# Patient Record
Sex: Male | Born: 1995 | Race: Black or African American | Hispanic: No | Marital: Single | Smoking: Never smoker
Health system: Southern US, Community
[De-identification: ages and names within clinical notes are randomized; demographics above are authoritative.]

## PROBLEM LIST (undated history)

## (undated) DIAGNOSIS — S52502A Unspecified fracture of the lower end of left radius, initial encounter for closed fracture: Secondary | ICD-10-CM

## (undated) HISTORY — PX: NO PAST SURGERIES: SHX2092

---

## 2017-01-22 ENCOUNTER — Emergency Department (HOSPITAL_COMMUNITY): Payer: Self-pay

## 2017-01-22 ENCOUNTER — Encounter (HOSPITAL_COMMUNITY): Payer: Self-pay

## 2017-01-22 DIAGNOSIS — Y9367 Activity, basketball: Secondary | ICD-10-CM | POA: Insufficient documentation

## 2017-01-22 DIAGNOSIS — S52502A Unspecified fracture of the lower end of left radius, initial encounter for closed fracture: Secondary | ICD-10-CM

## 2017-01-22 DIAGNOSIS — S52592A Other fractures of lower end of left radius, initial encounter for closed fracture: Secondary | ICD-10-CM | POA: Insufficient documentation

## 2017-01-22 DIAGNOSIS — Y999 Unspecified external cause status: Secondary | ICD-10-CM | POA: Insufficient documentation

## 2017-01-22 DIAGNOSIS — Y929 Unspecified place or not applicable: Secondary | ICD-10-CM | POA: Insufficient documentation

## 2017-01-22 DIAGNOSIS — S52612A Displaced fracture of left ulna styloid process, initial encounter for closed fracture: Secondary | ICD-10-CM | POA: Insufficient documentation

## 2017-01-22 DIAGNOSIS — W1830XA Fall on same level, unspecified, initial encounter: Secondary | ICD-10-CM | POA: Insufficient documentation

## 2017-01-22 HISTORY — DX: Unspecified fracture of the lower end of left radius, initial encounter for closed fracture: S52.502A

## 2017-01-22 MED ORDER — FENTANYL CITRATE (PF) 100 MCG/2ML IJ SOLN: 50.0000 ug | INTRAMUSCULAR | Status: DC | PRN

## 2017-01-22 MED ORDER — FENTANYL CITRATE (PF) 100 MCG/2ML IJ SOLN
INTRAMUSCULAR | Status: AC
  Filled 2017-01-22: qty 2

## 2017-01-22 NOTE — ED Triage Notes (Signed)
Onset 15 min PTA pt dunked basketball and fell over on left arm.  Obvious deformity to left wrist.  + radial pulse.

## 2017-01-23 ENCOUNTER — Emergency Department (HOSPITAL_COMMUNITY)
Admission: EM | Admit: 2017-01-23 | Discharge: 2017-01-23 | Disposition: A | Discharge status: Home / Self Care | Payer: Self-pay | Attending: Emergency Medicine | Admitting: Emergency Medicine

## 2017-01-23 DIAGNOSIS — S52592A Other fractures of lower end of left radius, initial encounter for closed fracture: Secondary | ICD-10-CM

## 2017-01-23 DIAGNOSIS — S52612A Displaced fracture of left ulna styloid process, initial encounter for closed fracture: Secondary | ICD-10-CM

## 2017-01-23 MED ORDER — OXYCODONE-ACETAMINOPHEN 5-325 MG PO TABS
1.0000 | ORAL_TABLET | Freq: Once | ORAL | Status: AC
  Administered 2017-01-23: 1 via ORAL
  Filled 2017-01-23: qty 1

## 2017-01-23 MED ORDER — HYDROCODONE-ACETAMINOPHEN 5-325 MG PO TABS: 1.0000 | ORAL_TABLET | Freq: Four times a day (QID) | ORAL | 0 refills | Status: DC | PRN

## 2017-01-23 NOTE — Progress Notes (Signed)
Orthopedic Tech Progress Note Patient Details:  Frederick Bauer 09/22/1996 [id number]  Ortho Devices Type of Ortho Device: Sugartong splint Ortho Device/Splint Location: lue Ortho Device/Splint Interventions: Ordered, Application   [name], Honor Fairbank J 01/23/2017, 3:25 AM

## 2017-01-23 NOTE — Discharge Instructions (Signed)
You were seen today and have a fracture of the bones in your wrist. Maintain the splint until you follow-up with the hand surgeon. You need to call his office later today for follow-up as soon as possible. If you develop numbness, tingling or worsening pain you should be reevaluated immediately.

## 2017-01-23 NOTE — ED Notes (Signed)
Checked with pt.  He states that he is "ok"

## 2017-01-23 NOTE — ED Provider Notes (Signed)
[facility]-EMERGENCY DEPT Provider Note   CSN: [id number] Arrival date & time: 01/22/17  2141  By signing my name below, I, Arianna Nassar and Diona BrownerJennifer Gorman, attest that this documentation has been prepared under the direction and in the presence of Shon Batonourtney F Latoyna Hird, MD.  Electronically Signed: Octavia HeirArianna Nassar, ED Scribe. 01/23/17. 1:19 AM.    History   Chief Complaint Chief Complaint  Patient presents with  . Wrist Injury    HPI HPI Comments: Frederick Bauer is a 21 y.o. male who presents to the Emergency Department complaining of left wrist pain s/p a fall that occurred earlier this evening. Pt rates his current pain a 6-7/10. He reports he was playing basketball and fell after dunking the ball in the basket. Pt states he did not hit his head or lose consciousness. He expresses his pain is worse with movement. There are no other injuries noted. Pt denies numbness or tingling of his left wrist.   The history is provided by the patient. No language interpreter was used.    History reviewed. No pertinent past medical history.  There are no active problems to display for this patient.   History reviewed. No pertinent surgical history.     Home Medications    Prior to Admission medications   Not on File    Family History History reviewed. No pertinent family history.  Social History Social History  Substance Use Topics  . Smoking status: Never Smoker  . Smokeless tobacco: Never Used  . Alcohol use No     Allergies   Patient has no known allergies.   Review of Systems Review of Systems  Musculoskeletal: Positive for arthralgias.  Neurological: Negative for syncope, weakness and numbness.  All other systems reviewed and are negative.    Physical Exam Updated Vital Signs BP 155/76   Pulse 82   Temp 99.2 F (37.3 C) (Oral)   Resp 18   Ht 6\' 2"  (1.88 m)   Wt 175 lb (79.4 kg)   SpO2 100%   BMI 22.47 kg/m   Physical Exam  Constitutional: He is oriented to  person, place, and time. He appears well-developed and well-nourished. No distress.  HENT:  Head: Normocephalic and atraumatic.  Cardiovascular: Normal rate and regular rhythm.   Pulmonary/Chest: Effort normal. No respiratory distress.  Musculoskeletal:  Swelling and deformity noted of the left distal wrist, swelling mostly over the dorsum of the wrist, 2+ radial pulse, normal flexion and extension of the fingers, Refill normal, no numbness  Neurological: He is alert and oriented to person, place, and time.  Skin: Skin is warm and dry.  Psychiatric: He has a normal mood and affect.  Nursing note and vitals reviewed.    ED Treatments / Results  DIAGNOSTIC STUDIES: Oxygen Saturation is 100% on RA, normal by my interpretation.  COORDINATION OF CARE: 1:22 AM Discussed treatment plan  with pt at bedside and pt agreed to plan.   Labs (all labs ordered are listed, but only abnormal results are displayed) Labs Reviewed - No data to display  EKG  EKG Interpretation None       Radiology Dg Wrist Complete Left  Result Date: 01/22/2017 CLINICAL DATA:  Status post fall while dunking basketball, with generalized left wrist pain and deformity. Initial encounter. EXAM: LEFT WRIST - COMPLETE 3+ VIEW COMPARISON:  None. FINDINGS: There is a comminuted and impacted fracture of the distal radius, with mild dorsal angulation. A displaced ulnar styloid fracture is also seen. The carpal rows articulate normally  with the distal radial fragments. Surrounding soft swelling is noted. IMPRESSION: Comminuted and impacted fracture of the distal radius, with mild dorsal angulation. Displaced ulnar styloid fracture noted. Electronically Signed   By: Roanna Raider M.D.   On: 01/22/2017 22:27    Procedures Procedures (including critical care time)  Medications Ordered in ED Medications  fentaNYL (SUBLIMAZE) injection 50 mcg (50 mcg Nasal Not Given 01/22/17 2319)  oxyCODONE-acetaminophen (PERCOCET/ROXICET)  5-325 MG per tablet 1 tablet (not administered)     Initial Impression / Assessment and Plan / ED Course  I have reviewed the triage vital signs and the nursing notes.  Pertinent labs & imaging results that were available during my care of the patient were reviewed by me and considered in my medical decision making (see chart for details).     Patient presents with left wrist injury. X-ray with comminuted and impacted distal radius fracture with mild dorsal angulation. Patient is neurovascularly intact. Do not feel that closed reduction would yield any better alignment. Patient was placed in a sugar tong splint. Will give follow-up with Dr. Janee Morn.  After history, exam, and medical workup I feel the patient has been appropriately medically screened and is safe for discharge home. Pertinent diagnoses were discussed with the patient. Patient was given return precautions.   Final Clinical Impressions(s) / ED Diagnoses   Final diagnoses:  Other closed fracture of distal end of left radius, initial encounter    New Prescriptions New Prescriptions   No medications on file   I personally performed the services described in this documentation, which was scribed in my presence. The recorded information has been reviewed and is accurate.     Shon Baton, MD 01/23/17 [phone]

## 2017-01-24 ENCOUNTER — Other Ambulatory Visit: Payer: Self-pay | Admitting: Orthopedic Surgery

## 2017-01-24 ENCOUNTER — Encounter (HOSPITAL_BASED_OUTPATIENT_CLINIC_OR_DEPARTMENT_OTHER): Payer: Self-pay | Admitting: *Deleted

## 2017-01-25 NOTE — Discharge Instructions (Addendum)
Discharge Instructions   You have a dressing with a plaster splint incorporated in it. Move your fingers as much as possible, making a full fist and fully opening the fist. Elevate your hand above your elbow to reduce pain & swelling of the digits.  Ice over the operative site and/or in the arm pit may be helpful to reduce pain & swelling.  DO NOT USE HEAT. Pain medicine was prescribed for you in the office during pre-op.  Take Tylenol as it states on the bottle and Ibuprofen 600 mg every 6 hours. Use the pain medicine prescribed only for severe break through pain. Leave the dressing in place until you return to our office.  You may shower, but keep the bandage clean & dry.  You may drive a car when you are off of prescription pain medications and can safely control your vehicle with both hands. Our office will call you to arrange follow-up   Please call [phone] during normal business hours or [phone] after hours for any problems. Including the following:  - excessive redness of the incisions - drainage for more than 4 days - fever of more than 101.5 F  *Please note that pain medications will not be refilled after hours or on weekends.  The patient plans to return to school on 01/29/2017.    Regional Anesthesia Blocks  1. Numbness or the inability to move the "blocked" extremity may last from 3-48 hours after placement. The length of time depends on the medication injected and your individual response to the medication. If the numbness is not going away after 48 hours, call your surgeon.  2. The extremity that is blocked will need to be protected until the numbness is gone and the  Strength has returned. Because you cannot feel it, you will need to take extra care to avoid injury. Because it may be weak, you may have difficulty moving it or using it. You may not know what position it is in without looking at it while the block is in effect.  3. For blocks in the legs and  feet, returning to weight bearing and walking needs to be done carefully. You will need to wait until the numbness is entirely gone and the strength has returned. You should be able to move your leg and foot normally before you try and bear weight or walk. You will need someone to be with you when you first try to ensure you do not fall and possibly risk injury.  4. Bruising and tenderness at the needle site are common side effects and will resolve in a few days.  5. Persistent numbness or new problems with movement should be communicated to the surgeon or the [facility] [phone] [facility] [phone]).     Post Anesthesia Home Care Instructions  Activity: Get plenty of rest for the remainder of the day. A responsible adult should stay with you for 24 hours following the procedure.  For the next 24 hours, DO NOT: -Drive a car -Advertising copywriter -Drink alcoholic beverages -Take any medication unless instructed by your physician -Make any legal decisions or sign important papers.  Meals: Start with liquid foods such as gelatin or soup. Progress to regular foods as tolerated. Avoid greasy, spicy, heavy foods. If nausea and/or vomiting occur, drink only clear liquids until the nausea and/or vomiting subsides. Call your physician if vomiting continues.  Special Instructions/Symptoms: Your throat may feel dry or sore from the anesthesia or the breathing tube  placed in your throat during surgery. If this causes discomfort, gargle with warm salt water. The discomfort should disappear within 24 hours.  If you had a scopolamine patch placed behind your ear for the management of post- operative nausea and/or vomiting:  1. The medication in the patch is effective for 72 hours, after which it should be removed.  Wrap patch in a tissue and discard in the trash. Wash hands thoroughly with soap and water. 2. You may remove the patch earlier than 72 hours if you  experience unpleasant side effects which may include dry mouth, dizziness or visual disturbances. 3. Avoid touching the patch. Wash your hands with soap and water after contact with the patch.

## 2017-01-25 NOTE — H&P (Signed)
Frederick Bauer is an 21 y.o. male.   CC / Reason for Visit: Left wrist injury HPI: This patient is an 21 year old RHD male who presents for evaluation of a left wrist injury that occurred when he fell playing basketball.  He was evaluated in emergency department and placed into a sugar tong splint.  He presents for further evaluation.  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  Past Medical History:  Diagnosis Date  . Distal radius fracture, left 01/22/2017    Past Surgical History:  Procedure Laterality Date  . NO PAST SURGERIES      History reviewed. No pertinent family history. Social History:  reports that he has never smoked. He has never used smokeless tobacco. He reports that he does not drink alcohol or use drugs.  Allergies: No Known Allergies  No prescriptions prior to admission.    No results found for this or any previous visit (from the past 48 hour(s)). No results found.  Review of Systems  All other systems reviewed and are negative.   Height 6\' 2"  (1.88 m), weight 79.4 kg (175 lb). Physical Exam   Constitutional:  WD, WN, NAD HEENT:  NCAT, EOMI Neuro/Psych:  Alert & oriented to person, place, and time; appropriate mood & affect Lymphatic: No generalized UE edema or lymphadenopathy Extremities / MSK:  Both UE are normal with respect to appearance, ranges of motion, joint stability, muscle strength/tone, sensation, & perfusion except as otherwise noted:  Left upper extremity sugar tong splint is intact, intact light touch sensibility in the radial, median, and ulnar nerve distributions with intact motor to the same.  No ulcerations around the periphery of the splint.  Labs / Xrays:  No radiographic studies obtained today.  Injury x-rays are reviewed, revealing a extra-articular distal radius fracture with dorsal translational displacement and tilt of about 27  Assessment: Comminuted displaced and angulated left  distal radius fracture  Plan:  I discussed these findings with him and indications for operative intervention.  We will tentatively plan to proceed with surgery on Monday.  The details of the operative procedure were discussed with the patient.  Questions were invited and answered.  In addition to the goal of the procedure, the risks of the procedure to include but not limited to bleeding; infection; damage to the nerves or blood vessels that could result in bleeding, numbness, weakness, chronic pain, and the need for additional procedures; stiffness; the need for revision surgery; and anesthetic risks were reviewed.  No specific outcome was guaranteed or implied.  Informed consent was obtained.  Rajan Burgard A., MD 01/25/2017, 5:17 PM

## 2017-01-28 ENCOUNTER — Encounter (HOSPITAL_BASED_OUTPATIENT_CLINIC_OR_DEPARTMENT_OTHER): Payer: Self-pay | Admitting: Certified Registered"

## 2017-01-28 ENCOUNTER — Ambulatory Visit (HOSPITAL_BASED_OUTPATIENT_CLINIC_OR_DEPARTMENT_OTHER)
Admission: RE | Admit: 2017-01-28 | Discharge: 2017-01-28 | Disposition: A | Discharge status: Home / Self Care | Payer: 59 | Source: Ambulatory Visit | Attending: Orthopedic Surgery | Admitting: Orthopedic Surgery

## 2017-01-28 ENCOUNTER — Ambulatory Visit (HOSPITAL_BASED_OUTPATIENT_CLINIC_OR_DEPARTMENT_OTHER): Payer: 59 | Admitting: Certified Registered"

## 2017-01-28 ENCOUNTER — Encounter (HOSPITAL_BASED_OUTPATIENT_CLINIC_OR_DEPARTMENT_OTHER)
Admission: RE | Disposition: A | Discharge status: Home / Self Care | Payer: Self-pay | Source: Ambulatory Visit | Attending: Orthopedic Surgery

## 2017-01-28 ENCOUNTER — Ambulatory Visit (HOSPITAL_COMMUNITY): Payer: 59

## 2017-01-28 DIAGNOSIS — S52552A Other extraarticular fracture of lower end of left radius, initial encounter for closed fracture: Secondary | ICD-10-CM | POA: Insufficient documentation

## 2017-01-28 DIAGNOSIS — Z419 Encounter for procedure for purposes other than remedying health state, unspecified: Secondary | ICD-10-CM

## 2017-01-28 DIAGNOSIS — W19XXXA Unspecified fall, initial encounter: Secondary | ICD-10-CM | POA: Diagnosis not present

## 2017-01-28 HISTORY — DX: Unspecified fracture of the lower end of left radius, initial encounter for closed fracture: S52.502A

## 2017-01-28 HISTORY — PX: OPEN REDUCTION INTERNAL FIXATION (ORIF) DISTAL RADIAL FRACTURE: SHX5989

## 2017-01-28 SURGERY — OPEN REDUCTION INTERNAL FIXATION (ORIF) DISTAL RADIUS FRACTURE
Anesthesia: General | Site: Wrist | Laterality: Left

## 2017-01-28 MED ORDER — LIDOCAINE HCL (CARDIAC) 20 MG/ML IV SOLN
INTRAVENOUS | Status: DC | PRN
  Administered 2017-01-28: 30 mg via INTRAVENOUS

## 2017-01-28 MED ORDER — PROPOFOL 10 MG/ML IV BOLUS
INTRAVENOUS | Status: DC | PRN
  Administered 2017-01-28: 200 mg via INTRAVENOUS

## 2017-01-28 MED ORDER — MIDAZOLAM HCL 2 MG/2ML IJ SOLN
INTRAMUSCULAR | Status: AC
  Filled 2017-01-28: qty 2

## 2017-01-28 MED ORDER — ACETAMINOPHEN 325 MG PO TABS: 650.0000 mg | ORAL_TABLET | Freq: Four times a day (QID) | ORAL | Status: DC | PRN

## 2017-01-28 MED ORDER — LACTATED RINGERS IV SOLN: INTRAVENOUS | Status: DC

## 2017-01-28 MED ORDER — SCOPOLAMINE 1 MG/3DAYS TD PT72: 1.0000 | MEDICATED_PATCH | Freq: Once | TRANSDERMAL | Status: DC | PRN

## 2017-01-28 MED ORDER — METOCLOPRAMIDE HCL 5 MG/ML IJ SOLN: 10.0000 mg | Freq: Once | INTRAMUSCULAR | Status: DC | PRN

## 2017-01-28 MED ORDER — FENTANYL CITRATE (PF) 100 MCG/2ML IJ SOLN: 25.0000 ug | INTRAMUSCULAR | Status: DC | PRN

## 2017-01-28 MED ORDER — ONDANSETRON HCL 4 MG/2ML IJ SOLN
INTRAMUSCULAR | Status: DC | PRN
  Administered 2017-01-28: 4 mg via INTRAVENOUS

## 2017-01-28 MED ORDER — LACTATED RINGERS IV SOLN
INTRAVENOUS | Status: DC
  Administered 2017-01-28 (×2): via INTRAVENOUS

## 2017-01-28 MED ORDER — CLINDAMYCIN PHOSPHATE 900 MG/50ML IV SOLN
INTRAVENOUS | Status: AC
  Filled 2017-01-28: qty 50

## 2017-01-28 MED ORDER — FENTANYL CITRATE (PF) 100 MCG/2ML IJ SOLN
50.0000 ug | INTRAMUSCULAR | Status: DC | PRN
  Administered 2017-01-28: 100 ug via INTRAVENOUS

## 2017-01-28 MED ORDER — ROPIVACAINE HCL 7.5 MG/ML IJ SOLN
INTRAMUSCULAR | Status: DC | PRN
  Administered 2017-01-28: 20 mL via PERINEURAL

## 2017-01-28 MED ORDER — CLINDAMYCIN PHOSPHATE 900 MG/50ML IV SOLN: 900.0000 mg | INTRAVENOUS | Status: DC

## 2017-01-28 MED ORDER — MIDAZOLAM HCL 2 MG/2ML IJ SOLN
1.0000 mg | INTRAMUSCULAR | Status: DC | PRN
  Administered 2017-01-28: 2 mg via INTRAVENOUS

## 2017-01-28 MED ORDER — CEFAZOLIN SODIUM-DEXTROSE 2-3 GM-% IV SOLR
INTRAVENOUS | Status: DC | PRN
  Administered 2017-01-28: 2 g via INTRAVENOUS

## 2017-01-28 MED ORDER — MEPERIDINE HCL 25 MG/ML IJ SOLN: 6.2500 mg | INTRAMUSCULAR | Status: DC | PRN

## 2017-01-28 MED ORDER — DEXAMETHASONE SODIUM PHOSPHATE 10 MG/ML IJ SOLN
INTRAMUSCULAR | Status: DC | PRN
  Administered 2017-01-28: 10 mg via INTRAVENOUS

## 2017-01-28 MED ORDER — IBUPROFEN 200 MG PO TABS: 600.0000 mg | ORAL_TABLET | Freq: Four times a day (QID) | ORAL | 0 refills | Status: DC | PRN

## 2017-01-28 MED ORDER — FENTANYL CITRATE (PF) 100 MCG/2ML IJ SOLN
INTRAMUSCULAR | Status: AC
  Filled 2017-01-28: qty 2

## 2017-01-28 MED ORDER — CEFAZOLIN SODIUM-DEXTROSE 2-4 GM/100ML-% IV SOLN
INTRAVENOUS | Status: AC
  Filled 2017-01-28: qty 100

## 2017-01-28 SURGICAL SUPPLY — 65 items
BANDAGE COBAN STERILE 2 (GAUZE/BANDAGES/DRESSINGS) IMPLANT
BIT DRILL SOLID 2.0X40MM (BIT) IMPLANT
BIT DRILL SOLID 2.5X40MM (BIT) IMPLANT
BLADE MINI RND TIP GREEN BEAV (BLADE) IMPLANT
BLADE SURG 15 STRL LF DISP TIS (BLADE) ×2 IMPLANT
BLADE SURG 15 STRL SS (BLADE) ×4
BNDG COHESIVE 4X5 TAN STRL (GAUZE/BANDAGES/DRESSINGS) ×3 IMPLANT
BNDG ESMARK 4X9 LF (GAUZE/BANDAGES/DRESSINGS) ×3 IMPLANT
BNDG GAUZE ELAST 4 BULKY (GAUZE/BANDAGES/DRESSINGS) ×3 IMPLANT
BRUSH SCRUB EZ PLAIN DRY (MISCELLANEOUS) IMPLANT
CANISTER SUCT 1200ML W/VALVE (MISCELLANEOUS) ×3 IMPLANT
CHLORAPREP W/TINT 26ML (MISCELLANEOUS) ×3 IMPLANT
CORDS BIPOLAR (ELECTRODE) ×3 IMPLANT
COVER BACK TABLE 60X90IN (DRAPES) ×3 IMPLANT
COVER MAYO STAND STRL (DRAPES) ×3 IMPLANT
CUFF TOURNIQUET SINGLE 18IN (TOURNIQUET CUFF) ×3 IMPLANT
CUFF TOURNIQUET SINGLE 24IN (TOURNIQUET CUFF) IMPLANT
DRAPE C-ARM 42X72 X-RAY (DRAPES) ×3 IMPLANT
DRAPE EXTREMITY T 121X128X90 (DRAPE) ×3 IMPLANT
DRAPE SURG 17X23 STRL (DRAPES) ×3 IMPLANT
DRILL SOLID 2.0X40MM (BIT)
DRILL SOLID 2.5X40MM (BIT)
DRSG ADAPTIC 3X8 NADH LF (GAUZE/BANDAGES/DRESSINGS) IMPLANT
DRSG EMULSION OIL 3X3 NADH (GAUZE/BANDAGES/DRESSINGS) ×3 IMPLANT
ELECT REM PT RETURN 9FT ADLT (ELECTROSURGICAL) ×3
ELECTRODE REM PT RTRN 9FT ADLT (ELECTROSURGICAL) ×1 IMPLANT
GLOVE BIO SURGEON STRL SZ7.5 (GLOVE) ×3 IMPLANT
GLOVE BIOGEL PI IND STRL 7.0 (GLOVE) ×3 IMPLANT
GLOVE BIOGEL PI IND STRL 8 (GLOVE) ×1 IMPLANT
GLOVE BIOGEL PI INDICATOR 7.0 (GLOVE) ×6
GLOVE BIOGEL PI INDICATOR 8 (GLOVE) ×2
GLOVE ECLIPSE 6.5 STRL STRAW (GLOVE) ×6 IMPLANT
GOWN STRL REUS W/ TWL LRG LVL3 (GOWN DISPOSABLE) ×2 IMPLANT
GOWN STRL REUS W/TWL LRG LVL3 (GOWN DISPOSABLE) ×4
GOWN STRL REUS W/TWL XL LVL3 (GOWN DISPOSABLE) ×3 IMPLANT
NEEDLE HYPO 25X1 1.5 SAFETY (NEEDLE) IMPLANT
NS IRRIG 1000ML POUR BTL (IV SOLUTION) ×3 IMPLANT
PACK BASIN DAY SURGERY FS (CUSTOM PROCEDURE TRAY) ×3 IMPLANT
PADDING CAST ABS 4INX4YD NS (CAST SUPPLIES) ×2
PADDING CAST ABS COTTON 4X4 ST (CAST SUPPLIES) ×1 IMPLANT
PENCIL BUTTON HOLSTER BLD 10FT (ELECTRODE) ×3 IMPLANT
RUBBERBAND STERILE (MISCELLANEOUS) IMPLANT
SKELETAL DYNAMICS DVR SET (Set) ×3 IMPLANT
SLEEVE SCD COMPRESS KNEE MED (MISCELLANEOUS) ×3 IMPLANT
SLING ARM FOAM STRAP LRG (SOFTGOODS) IMPLANT
SLING ARM FOAM STRAP XLG (SOFTGOODS) ×3 IMPLANT
SPLINT PLASTER CAST XFAST 3X15 (CAST SUPPLIES) ×8 IMPLANT
SPLINT PLASTER XTRA FASTSET 3X (CAST SUPPLIES) ×16
SPONGE GAUZE 4X4 12PLY STER LF (GAUZE/BANDAGES/DRESSINGS) ×3 IMPLANT
SQUARE TIP 2.0 DRIVER ×6 IMPLANT
STOCKINETTE 6  STRL (DRAPES) ×2
STOCKINETTE 6 STRL (DRAPES) ×1 IMPLANT
SUCTION FRAZIER HANDLE 10FR (MISCELLANEOUS) ×2
SUCTION TUBE FRAZIER 10FR DISP (MISCELLANEOUS) ×1 IMPLANT
SUT VIC AB 2-0 PS2 27 (SUTURE) ×3 IMPLANT
SUT VICRYL 4-0 PS2 18IN ABS (SUTURE) IMPLANT
SUT VICRYL RAPIDE 4-0 (SUTURE) IMPLANT
SUT VICRYL RAPIDE 4/0 PS 2 (SUTURE) ×3 IMPLANT
SYR 10ML LL (SYRINGE) IMPLANT
SYR BULB 3OZ (MISCELLANEOUS) ×3 IMPLANT
TOWEL OR 17X24 6PK STRL BLUE (TOWEL DISPOSABLE) ×3 IMPLANT
TOWEL OR NON WOVEN STRL DISP B (DISPOSABLE) ×3 IMPLANT
TUBE CONNECTING 20'X1/4 (TUBING) ×1
TUBE CONNECTING 20X1/4 (TUBING) ×2 IMPLANT
UNDERPAD 30X30 (UNDERPADS AND DIAPERS) ×3 IMPLANT

## 2017-01-28 NOTE — Op Note (Signed)
01/28/2017  9:14 AM  PATIENT:  Frederick Bauer  21 y.o. male  PRE-OPERATIVE DIAGNOSIS:  Displaced and angulated left distal radius fracture  POST-OPERATIVE DIAGNOSIS:  Same  PROCEDURE:  ORIF left extra-articular distal radius fracture  SURGEON: Cliffton Astersavid A. Janee Mornhompson, MD  PHYSICIAN ASSISTANT: Danielle RankinKirsten Schrader, OPA-C  ANESTHESIA:  regional and general  SPECIMENS:  None  DRAINS: None  EBL:  less than 50 mL  PREOPERATIVE INDICATIONS:  Frederick Bauer is a  21 y.o. male with displaced, angulated extra-articular distal radius fracture  The risks benefits and alternatives were discussed with the patient preoperatively including but not limited to the risks of infection, bleeding, nerve injury, cardiopulmonary complications, the need for revision surgery, among others, and the patient verbalized understanding and consented to proceed.  OPERATIVE IMPLANTS: skeletal dynamics geminus distal radius plate/screws/pegs  OPERATIVE PROCEDURE: After receiving prophylactic antibiotics and a regional block, the patient was escorted to the operative theatre and placed in a supine position. General anesthesia was administered.  A surgical "time-out" was performed during which the planned procedure, proposed operative site, and the correct patient identity were compared to the operative consent and agreement confirmed by the circulating nurse according to current facility policy. Following application of a tourniquet to the operative extremity, the exposed skin was pre-scrub with Hibiclens scrub brush and then was prepped with Chloraprep and draped in the usual sterile fashion. The limb was exsanguinated with an Esmarch bandage and the tourniquet inflated to approximately 100mmHg higher than systolic BP.   A sinusoidal-shaped incision was marked and made over the FCR axis and the distal forearm. The skin was incised sharply with scalpel, subcutaneous tissues with blunt and spreading dissection. The FCR axis was  exploited deeply. The pronator quadratus was reflected in an L-shaped ulnarly and the brachioradialis was split in a Z-plasty fashion for later reapproximation. The fracture was inspected and provisionally reduced.  This was confirmed fluoroscopically. The appropriately sized plate was selected and found to fit well. It was placed in its provisional alignment of the radius and this was confirmed fluoroscopically.  It was secured to the radius with a screw through the slotted hole.  Additional adjustments were made as necessary, and the distal holes were all drilled and filled.  Peg/screw length distally was selected on the shorter side of measurements to minimize the risk for dorsal cortical penetration. The remainder of the proximal holes were drilled and filled.   Final images were obtained and the DRUJ was examined for stability. It was found to be sufficiently stable. The wound was then copiously irrigated and the brachioradialis repaired with 2-0 Vicryl Rapide suture followed by repair of the pronator quadratus with the same suture type. Tourniquet was released and additional hemostasis obtained and the skin was closed with 2-0 Vicryl deep dermal buried sutures followed by running 4-0 Vicryl Rapide horizontal mattress suture in the skin. A bulky dressing with a volar plaster component was applied and she was taken to room stable condition.  DISPOSITION: The patient will be discharged home today with typical post-op instructions, returning in 10-15 days for reevaluation with new x-rays of the affected wrist out of the splint to include an inclined lateral and then transition to therapy to have a custom splint constructed and begin rehabilitation.

## 2017-01-28 NOTE — Anesthesia Postprocedure Evaluation (Signed)
Anesthesia Post Note  Patient: Frederick Bauer  Procedure(s) Performed: Procedure(s) (LRB): OPEN TREATMENT OF LEFT DISTAL RADIUS FRACTURE (Left)  Patient location during evaluation: PACU Anesthesia Type: General and Regional Level of consciousness: awake and alert Pain management: pain level controlled Vital Signs Assessment: post-procedure vital signs reviewed and stable Respiratory status: spontaneous breathing, nonlabored ventilation, respiratory function stable and patient connected to nasal cannula oxygen Cardiovascular status: blood pressure returned to baseline and stable Postop Assessment: no signs of nausea or vomiting Anesthetic complications: no       Last Vitals:  Vitals:   01/28/17 1100 01/28/17 1139  BP: 130/80 (!) 147/85  Pulse: (!) 58 (!) 58  Resp: 13 14  Temp:  36.6 C    Last Pain:  Vitals:   01/28/17 1139  TempSrc: Oral  PainSc: 0-No pain                 Phillips Groutarignan, Vonzell Lindblad

## 2017-01-28 NOTE — Anesthesia Procedure Notes (Signed)
Procedure Name: LMA Insertion Date/Time: 01/28/2017 9:20 AM Performed by: Cliffton Spradley D Pre-anesthesia Checklist: Patient identified, Emergency Drugs available, Suction available and Patient being monitored Patient Re-evaluated:Patient Re-evaluated prior to inductionOxygen Delivery Method: Circle system utilized Preoxygenation: Pre-oxygenation with 100% oxygen Intubation Type: IV induction Ventilation: Mask ventilation without difficulty LMA: LMA inserted LMA Size: 4.0 Number of attempts: 1 Airway Equipment and Method: Bite block Placement Confirmation: positive ETCO2 Tube secured with: Tape Dental Injury: Teeth and Oropharynx as per pre-operative assessment

## 2017-01-28 NOTE — Anesthesia Procedure Notes (Signed)
Anesthesia Regional Block: Supraclavicular block   Pre-Anesthetic Checklist: ,, timeout performed, Correct Patient, Correct Site, Correct Laterality, Correct Procedure, Correct Position, site marked, Risks and benefits discussed,  Surgical consent,  Pre-op evaluation,  At surgeon's request and post-op pain management  Laterality: Left and Upper  Prep: Maximum Sterile Barrier Precautions used, chloraprep       Needles:  Injection technique: Single-shot  Needle Type: Echogenic Stimulator Needle     Needle Length: 10cm      Additional Needles:   Procedures: ultrasound guided,,,,,,,,  Narrative:  Start time: 01/28/2017 8:02 AM End time: 01/28/2017 8:12 AM Injection made incrementally with aspirations every 5 mL.  Performed by: Personally  Anesthesiologist: Phillips GroutARIGNAN, Deuce Paternoster  Additional Notes: Risks, benefits and alternative to block explained extensively.  Patient tolerated procedure well, without complications.

## 2017-01-28 NOTE — Progress Notes (Signed)
Assisted Dr. Carignan with left, ultrasound guided, supraclavicular block. Side rails up, monitors on throughout procedure. See vital signs in flow sheet. Tolerated Procedure well. 

## 2017-01-28 NOTE — Transfer of Care (Signed)
Immediate Anesthesia Transfer of Care Note  Patient: Frederick ScrewsJoshua Bauer  Procedure(s) Performed: Procedure(s) with comments: OPEN TREATMENT OF LEFT DISTAL RADIUS FRACTURE (Left) - GENERAL ANESTHESIA WITH PRE-OP BLOCK  Patient Location: PACU  Anesthesia Type:GA combined with regional for post-op pain  Level of Consciousness: awake and patient cooperative  Airway & Oxygen Therapy: Patient Spontanous Breathing and Patient connected to face mask oxygen  Post-op Assessment: Report given to RN and Post -op Vital signs reviewed and stable  Post vital signs: Reviewed and stable  Last Vitals:  Vitals:   01/28/17 0822 01/28/17 1016  BP:  121/71  Pulse: 78 (!) 56  Resp: 17 13  Temp:      Last Pain:  Vitals:   01/28/17 0726  TempSrc: Oral  PainSc: 0-No pain         Complications: No apparent anesthesia complications

## 2017-01-28 NOTE — Interval H&P Note (Signed)
History and Physical Interval Note:  01/28/2017 9:14 AM  Frederick ScrewsJoshua Foley  has presented today for surgery, with the diagnosis of LEFT DISTAL RADIUS FRACTURE S52.552A  The various methods of treatment have been discussed with the patient and family. After consideration of risks, benefits and other options for treatment, the patient has consented to  Procedure(s) with comments: OPEN TREATMENT OF LEFT DISTAL RADIUS FRACTURE (Left) - GENERAL ANESTHESIA WITH PRE-OP BLOCK as a surgical intervention .  The patient's history has been reviewed, patient examined, no change in status, stable for surgery.  I have reviewed the patient's chart and labs.  Questions were answered to the patient's satisfaction.     Lyndal Reggio A.

## 2017-01-28 NOTE — Anesthesia Preprocedure Evaluation (Signed)
Anesthesia Evaluation  Patient identified by MRN, date of birth, ID band Patient awake    Reviewed: Allergy & Precautions, NPO status , Patient's Chart, lab work & pertinent test results  Airway Mallampati: II  TM Distance: >3 FB Neck ROM: Full    Dental no notable dental hx.    Pulmonary neg pulmonary ROS,    Pulmonary exam normal breath sounds clear to auscultation       Cardiovascular negative cardio ROS Normal cardiovascular exam Rhythm:Regular Rate:Normal     Neuro/Psych negative neurological ROS  negative psych ROS   GI/Hepatic negative GI ROS, Neg liver ROS,   Endo/Other  negative endocrine ROS  Renal/GU negative Renal ROS  negative genitourinary   Musculoskeletal negative musculoskeletal ROS (+)   Abdominal   Peds negative pediatric ROS (+)  Hematology negative hematology ROS (+)   Anesthesia Other Findings   Reproductive/Obstetrics negative OB ROS                             Anesthesia Physical Anesthesia Plan  ASA: I  Anesthesia Plan: General   Post-op Pain Management:  Regional for Post-op pain   Induction: Intravenous  Airway Management Planned: LMA  Additional Equipment:   Intra-op Plan:   Post-operative Plan: Extubation in OR  Informed Consent: I have reviewed the patients History and Physical, chart, labs and discussed the procedure including the risks, benefits and alternatives for the proposed anesthesia with the patient or authorized representative who has indicated his/her understanding and acceptance.   Dental advisory given  Plan Discussed with: CRNA  Anesthesia Plan Comments: (L SCB)        Anesthesia Quick Evaluation

## 2017-01-29 ENCOUNTER — Encounter (HOSPITAL_BASED_OUTPATIENT_CLINIC_OR_DEPARTMENT_OTHER): Payer: Self-pay | Admitting: Orthopedic Surgery

## 2017-02-13 ENCOUNTER — Ambulatory Visit: Payer: 59 | Attending: Orthopedic Surgery | Admitting: *Deleted

## 2017-02-13 ENCOUNTER — Encounter: Payer: Self-pay | Admitting: *Deleted

## 2017-02-13 DIAGNOSIS — R601 Generalized edema: Secondary | ICD-10-CM | POA: Diagnosis present

## 2017-02-13 DIAGNOSIS — R278 Other lack of coordination: Secondary | ICD-10-CM | POA: Insufficient documentation

## 2017-02-13 DIAGNOSIS — M25632 Stiffness of left wrist, not elsewhere classified: Secondary | ICD-10-CM | POA: Diagnosis not present

## 2017-02-13 DIAGNOSIS — M25532 Pain in left wrist: Secondary | ICD-10-CM | POA: Insufficient documentation

## 2017-02-13 NOTE — Patient Instructions (Addendum)
WEARING SCHEDULE:  Wear splint at ALL times except for hygiene care (May remove splint for exercises and then immediately place back on ONLY if directed by the therapist)  PURPOSE:  To prevent movement and for protection until injury can heal  CARE OF SPLINT:  Keep splint away from heat sources including: stove, radiator or furnace, or a car in sunlight. The splint can melt and will no longer fit you properly  Keep away from pets and children  Clean the splint with rubbing alcohol 1-2 times per day.  * During this time, make sure you also clean your hand/arm as instructed by your therapist and/or perform dressing changes as needed. Then dry hand/arm completely before replacing splint. (When cleaning hand/arm, keep it immobilized in same position until splint is replaced)  PRECAUTIONS/POTENTIAL PROBLEMS: *If you notice or experience increased pain, swelling, numbness, or a lingering reddened area from the splint: Contact your therapist immediately by calling [phone]. You must wear the splint for protection, but we will get you scheduled for adjustments as quickly as possible.  (If only straps or hooks need to be replaced and NO adjustments to the splint need to be made, just call the office ahead and let them know you are coming in)  If you have any medical concerns or signs of infection, please call your doctor immediately  AROM: Finger Flexion / Extension    Actively bend fingers of left hand. Start with knuckles furthest from palm, and slowly make a fist. Hold _5___ seconds. Relax. Then straighten fingers as far as possible. Repeat _10___ times per set.. Do __3-4__ sessions per day.  Flexor Tendon Gliding (Active Full Fist)    Straighten all fingers, then make a fist, bending all joints. Repeat __10__ times. Do _3-4___ sessions per day.  Flexor Tendon Gliding (Active Straight Fist)    Start with fingers straight. Bend knuckles and middle joints. Keep fingertip joints  straight to touch base of palm. Repeat __10_ times. Do __3-4__ sessions per day. AROM: Finger Flexion / Extension  Straighten fingers fully between exercises.  Bend and straighten your elbow and shoulder (reach up over head etc).   Do not use your left hand to pick anything up until cleared by your doctor.

## 2017-02-13 NOTE — Therapy (Signed)
[facility] [address] [place], [place], [address] Phone: [phone]   Fax:  [phone]  Occupational Therapy Evaluation  Patient Details  Name: Frederick Bauer MRN: [id number] Date of Birth: February 10, 1996 Referring Provider: Dr Mack Hook  Encounter Date: 02/13/2017      OT End of Session - 02/13/17 0921    Visit Number 1   Number of Visits 12   Date for OT Re-Evaluation 03/27/17   Authorization Type [employer] 0 visit limit, 0 auth required. $40 copay   OT Start Time 0759   OT Stop Time 0907   OT Time Calculation (min) 68 min   Activity Tolerance Patient tolerated treatment well;No increased pain   Behavior During Therapy WFL for tasks assessed/performed      Past Medical History:  Diagnosis Date  . Distal radius fracture, left 01/22/2017    Past Surgical History:  Procedure Laterality Date  . NO PAST SURGERIES    . OPEN REDUCTION INTERNAL FIXATION (ORIF) DISTAL RADIAL FRACTURE Left 01/28/2017   Procedure: OPEN TREATMENT OF LEFT DISTAL RADIUS FRACTURE;  Surgeon: Mack Hook, MD;  Location: [facility];  Service: Orthopedics;  Laterality: Left;  GENERAL ANESTHESIA WITH PRE-OP BLOCK    There were no vitals filed for this visit.      Subjective Assessment - 02/13/17 0802    Subjective  Pt is a 21 y/o RHD Male s/p L distal Radius ORIF DOS = 01/28/17. He presents today for custom splinting L UE.   Currently in Pain? No/denies   Pain Score 0-No pain           [facility] OT Assessment - 02/13/17 0001      Assessment   Diagnosis L Distal Radius Fracture s/p ORIF    Referring Provider Dr Mack Hook   Onset Date 01/28/17   Prior Therapy None     Precautions   Precautions --  No use of L hand. Do not lift > pencil/paper   Required Braces or Orthoses Other Brace/Splint   Other Brace/Splint Presents with post-op splint here today for custom splinting LUE     Restrictions   Weight Bearing  Restrictions Yes   LUE Weight Bearing --     Balance Screen   Has the patient fallen in the past 6 months No  Not other than this injury   Has the patient had a decrease in activity level because of a fear of falling?  No   Is the patient reluctant to leave their home because of a fear of falling?  No     Home  Environment   Family/patient expects to be discharged to: Other (comment)  Pt is an [employer] Student; has roommates   Available Help at Discharge Available PRN/intermittently  Friends/room mates   Lives With Family     Prior Function   Level of Independence Independent   Vocation Student   Leisure Basketball;     ADL   Eating/Feeding Modified independent   Grooming Modified independent   Upper Body Bathing Modified independent   Lower Body Bathing Modified independent   Upper Body Dressing Increased time  Due to bulky dressing/cast   Lower Body Dressing Modified independent   Toilet Tranfer Modified independent   Toileting - Clothing Manipulation Modified independent   Tub/Shower Transfer Modified independent   ADL comments Increased time for tasks, related to ADL's and being a full time student. Bulky dressing gets in the way, slows him down some, but  otherwise independent     Mobility   Mobility Status Independent     Written Expression   Dominant Hand Right     Cognition   Overall Cognitive Status Within Functional Limits for tasks assessed     Observation/Other Assessments   Observations Pt presents in bulky post-op dressing for custom splinting today. He is able to make fist with left hand within cast. No significant edema noted   Skin Integrity No open areas along volar forearm/scar area. No drainage. Signs and symptoms of possible infection reviewed & discussed with pt in clinic today.. Pt was redressed with 4x4, gauze and stockinette.Marland Kitchen He was educated to keep hand dry & wear splint at all times for protection (unless otherwise instructed to remove for  exercises as per MD). He verbalized understanding of all of the above.     Sensation   Light Touch Appears Intact     Coordination   Gross Motor Movements are Fluid and Coordinated Yes  Shoulder, elbow and digits. Wrist NT @ this time   Fine Motor Movements are Fluid and Coordinated Yes  Able to perform tendon gliding and opposition ex's Left   Other Left wrist AROM is impaired secondary to recent fracture and ORIF on 01/28/17.     Edema   Edema Minimal edema noted throughout left non-dominant hand/wrist and forearm.     ROM / Strength   AROM / PROM / Strength AROM     AROM   Overall AROM Comments Pt able to perform AROM for tendon gliding noted Left hand. AROM Left elbow and shoulder are also WNL's. Pt was instructed to cont with L tendon gliding, elveation. AROM to elbow and shoulder. May begin gentle active wrist flex/exten when given "Ok" by MD. Discussed this with pt in clinic today. He verbalized understanding.                  OT Treatments/Exercises (OP) - 02/13/17 0001      Splinting   Splinting A left custom volar wrist cock-up splint was fabricated for pt as per MD orders today in in clinic. Pt will wear splint at all times, remove only for hygiene and Ex's as directed by MD and/or therapist. Splint places L wrist in functional position with slight extension.Marland Kitchen He was instructed in splint use, care and precautions and will f/u PRN for adjustments. Pt was given both written, verbal instruction in clinic today and was able to return demonstration after educated.        WEARING SCHEDULE:  Wear splint at ALL times except for hygiene care (May remove splint for exercises and then immediately place back on ONLY if directed by the therapist)  PURPOSE:  To prevent movement and for protection until injury can heal  CARE OF SPLINT:  Keep splint away from heat sources including: stove, radiator or furnace, or a car in sunlight. The splint can melt and will no longer fit  you properly  Keep away from pets and children  Clean the splint with rubbing alcohol 1-2 times per day.  * During this time, make sure you also clean your hand/arm as instructed by your therapist and/or perform dressing changes as needed. Then dry hand/arm completely before replacing splint. (When cleaning hand/arm, keep it immobilized in same position until splint is replaced)  PRECAUTIONS/POTENTIAL PROBLEMS: *If you notice or experience increased pain, swelling, numbness, or a lingering reddened area from the splint: Contact your therapist immediately by calling [phone]. You must wear the splint for  protection, but we will get you scheduled for adjustments as quickly as possible.  (If only straps or hooks need to be replaced and NO adjustments to the splint need to be made, just call the office ahead and let them know you are coming in)  If you have any medical concerns or signs of infection, please call your doctor immediately  AROM: Finger Flexion / Extension    Actively bend fingers of left hand. Start with knuckles furthest from palm, and slowly make a fist. Hold _5___ seconds. Relax. Then straighten fingers as far as possible. Repeat _10___ times per set.. Do __3-4__ sessions per day.  Flexor Tendon Gliding (Active Full Fist)    Straighten all fingers, then make a fist, bending all joints. Repeat __10__ times. Do _3-4___ sessions per day.  Flexor Tendon Gliding (Active Straight Fist)    Start with fingers straight. Bend knuckles and middle joints. Keep fingertip joints straight to touch base of palm. Repeat __10_ times. Do __3-4__ sessions per day. AROM: Finger Flexion / Extension  Straighten fingers fully between exercises.  Bend and straighten your elbow and shoulder (reach up over head etc).   Do not use your left hand to pick anything up until cleared by your doctor.         OT Education - 02/13/17 0920    Education provided Yes   Education Details Splint  use,care and precautions. Results, findings from eval and recommendations. Home program for L tendon gliding, AROM elbow and shoulder.   Person(s) Educated Patient   Methods Explanation;Demonstration;Verbal cues;Handout   Comprehension Verbalized understanding;Returned demonstration          OT Short Term Goals - 02/13/17 1234      OT SHORT TERM GOAL #1   Title Pt wil lbe Mod I splinting use, care and precautions L wrist   Time 3   Period Weeks   Status New     OT SHORT TERM GOAL #2   Title Pt will be Mod I edema control techniques left hand/wrist   Time 3   Period Weeks   Status New     OT SHORT TERM GOAL #3   Title Pt will report Mod I light ADL's related to grooming   Baseline 3   Period Weeks   Status New           OT Long Term Goals - 02/13/17 1235      OT LONG TERM GOAL #1   Title Pt will be Mod I updated HEP for L wrist/hand   Time 6   Period Weeks   Status New     OT LONG TERM GOAL #2   Title Pt will demonstrate AROM L wrist WFL's for performance of ADL's and school related tasks as seen by goniometer measurement   Time 6   Period Weeks   Status New     OT LONG TERM GOAL #3   Title Pt will demonstrate functional grip left hand via JAMAR assessment as compared to right dominant hand.   Time 6   Period Weeks   Status New     OT LONG TERM GOAL #4   Title Pt will be Independent ADL's and school related activities using left hand as non-dominant hand.   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 He underwent L distal radius ORIF on 01/28/17 and is currently 2 weeks and 2 days post-op. He plans to f/u w/ Dr Janee Mornhompson later today per his report. He demonstrates deficits in the areas of edema, decreased AROM and currently requires custom  protective splinting and a home program. He was fitted with a custom volar based L wrist cock-up splint per MD orders. He was educated in splint use, care and precautions and was also educated in a HEP for tendon gliding, AROM L elbow and shoulder as well. Will follow in out-pt OT for splint adjustemtns, upgrading of HEP and beginning AROM L wrist as directed by Dr Janee Mornhompson and distal radius ORIF protocol.   Rehab Potential Good   OT Frequency Other (comment)  1-2x/week for 6 weeks   OT Duration 6 weeks   OT Treatment/Interventions Self-care/ADL training;Therapeutic exercise;Moist Heat;Parrafin;Splinting;Fluidtherapy;Scar mobilization;Therapeutic exercises;Patient/family education;Ultrasound;Cryotherapy;DME and/or AE instruction;Manual Therapy;Passive range of motion;Therapeutic activities   Plan Splint check and adjustments PRN; Review and upgrade HEP as indicated by Dr Janee Mornhompson and ORIF L distal radius protocol.   Consulted and Agree with Plan of Care Patient      Patient will benefit from skilled therapeutic intervention in order to improve the following deficits and impairments:  Increased edema, Impaired flexibility, Pain, Decreased coordination, Decreased mobility, Decreased scar mobility, Decreased activity tolerance, Decreased range of motion, Decreased strength, Decreased knowledge of precautions, Impaired UE functional use  Visit Diagnosis: Wrist stiffness, left - Plan: Ot plan of care cert/re-cert  Other lack of coordination - Plan: Ot plan of care cert/re-cert  Pain in left wrist - Plan: Ot plan of care cert/re-cert  Generalized edema - Plan: Ot plan of care cert/re-cert    Problem List There are no active problems to display for this patient.   Mariam DollarBarnhill, Radie Berges Beth Dixon, OTR/L 02/13/2017, 12:47 PM  [facility] [address] [place], [place], [address] Phone: [phone]   Fax:  [phone]  Name: Henrine ScrewsJoshua  Elizardo MRN: [id number] Date of Birth: 08/23/1996

## 2017-02-20 ENCOUNTER — Ambulatory Visit: Payer: 59 | Admitting: Occupational Therapy

## 2017-02-20 DIAGNOSIS — R601 Generalized edema: Secondary | ICD-10-CM

## 2017-02-20 DIAGNOSIS — M25632 Stiffness of left wrist, not elsewhere classified: Secondary | ICD-10-CM | POA: Diagnosis not present

## 2017-02-20 DIAGNOSIS — M25532 Pain in left wrist: Secondary | ICD-10-CM

## 2017-02-20 DIAGNOSIS — R278 Other lack of coordination: Secondary | ICD-10-CM

## 2017-02-20 NOTE — Therapy (Signed)
[facility] [address] [place], [place], [address] Phone: [phone]   Fax:  [phone]  Occupational Therapy Treatment  Patient Details  Name: Crew Goren MRN: [id number] Date of Birth: 05-18-1996 Referring Provider: Dr Mack Hook  Encounter Date: 02/20/2017      OT End of Session - 02/20/17 1649    Visit Number 2   Number of Visits 12   Date for OT Re-Evaluation 03/27/17   Authorization Type [employer] 0 visit limit, 0 auth required. $40 copay   OT Start Time 1457   OT Stop Time 1535   OT Time Calculation (min) 38 min   Activity Tolerance Patient tolerated treatment well   Behavior During Therapy [facility] for tasks assessed/performed      Past Medical History:  Diagnosis Date  . Distal radius fracture, left 01/22/2017    Past Surgical History:  Procedure Laterality Date  . NO PAST SURGERIES    . OPEN REDUCTION INTERNAL FIXATION (ORIF) DISTAL RADIAL FRACTURE Left 01/28/2017   Procedure: OPEN TREATMENT OF LEFT DISTAL RADIUS FRACTURE;  Surgeon: Mack Hook, MD;  Location: [facility];  Service: Orthopedics;  Laterality: Left;  GENERAL ANESTHESIA WITH PRE-OP BLOCK    There were no vitals filed for this visit.      Subjective Assessment - 02/20/17 1504    Subjective  Pt reports splint is fitting well. Pt reports MD told him to perform ROM at wrist and forearm   Limitations A/ROM initiated as per previous discussion with Dr. Janee Morn for his distal radius fx pt's to begin ROM at time of splint fabrication   Currently in Pain? No/denies            [facility] OT Assessment - 02/20/17 0001      Precautions   Precautions Other (comment)   Precaution Comments Per pt report MD cleared him to begin wrist/ forearm ROM, therapist  began A/ROM, AA/ROM as per Dr. Carollee Massed protocol    Required Braces or Orthoses Other Brace/Splint  when not exercising or bathing           Treatment:  Fluidotherapy x 8 mins to  Left hand and wrist with pt performing A/ROM while in fluido. No adverse reactions. Scar massage to closed incision, pt verbalized understanding.                OT Education - 02/20/17 1643    Education provided Yes   Education Details splint wearing schedule, updated HEP and review of previsous HEP 10-15 reps each, -see pt instructions, scar massage and elevation and exercise as edema control techniques.   Person(s) Educated Patient   Methods Explanation;Demonstration;Verbal cues;Handout   Comprehension Verbalized understanding;Returned demonstration;Verbal cues required          OT Short Term Goals - 02/13/17 1234      OT SHORT TERM GOAL #1   Title Pt wil lbe Mod I splinting use, care and precautions L wrist   Time 3   Period Weeks   Status New     OT SHORT TERM GOAL #2   Title Pt will be Mod I edema control techniques left hand/wrist   Time 3   Period Weeks   Status New     OT SHORT TERM GOAL #3   Title Pt will report Mod I light ADL's related to grooming   Baseline 3   Period Weeks   Status New           OT Long Term  Goals - 02/13/17 1235      OT LONG TERM GOAL #1   Title Pt will be Mod I updated HEP for L wrist/hand   Time 6   Period Weeks   Status New     OT LONG TERM GOAL #2   Title Pt will demonstrate AROM L wrist WFL's for performance of ADL's and school related tasks as seen by goniometer measurement   Time 6   Period Weeks   Status New     OT LONG TERM GOAL #3   Title Pt will demonstrate functional grip left hand via JAMAR assessment as compared to right dominant hand.   Time 6   Period Weeks   Status New     OT LONG TERM GOAL #4   Title Pt will be Independent ADL's and school related activities using left hand as non-dominant hand.   Time 6   Period Weeks   Status New               Plan - 02/20/17 1645    Clinical Impression Statement Pt is progressing towards goals. He is tolerating splint  well and he demonstrates understanding of HEP. Therapsit progressed to A/ROM, AA/ROM as per Dr. Carollee Massedhompson's tx protocol for A/ROM, AA/ROM once cleared for splint.   Rehab Potential Good   OT Frequency --  1-2x week   OT Duration 6 weeks   OT Treatment/Interventions Self-care/ADL training;Therapeutic exercise;Moist Heat;Parrafin;Splinting;Fluidtherapy;Scar mobilization;Therapeutic exercises;Patient/family education;Ultrasound;Cryotherapy;DME and/or AE instruction;Manual Therapy;Passive range of motion;Therapeutic activities   Plan continue A/ROM, AA/ROM   Consulted and Agree with Plan of Care Patient      Patient will benefit from skilled therapeutic intervention in order to improve the following deficits and impairments:  Increased edema, Impaired flexibility, Pain, Decreased coordination, Decreased mobility, Decreased scar mobility, Decreased activity tolerance, Decreased range of motion, Decreased strength, Decreased knowledge of precautions, Impaired UE functional use  Visit Diagnosis: Other lack of coordination  Pain in left wrist  Wrist stiffness, left  Generalized edema    Problem List There are no active problems to display for this patient.   Nykerria Macconnell 02/20/2017, 4:56 PM  [facility] [address] [place], [place], [address] Phone: [phone]   Fax:  [phone]  Name: Henrine ScrewsJoshua Corp MRN: [id number] Date of Birth: 08/29/1996

## 2017-02-20 NOTE — Patient Instructions (Addendum)
AROM: Wrist Extension   .  With _left ___ palm down, bend wrist up. Repeat __15__ times per set.  Do __4-6__ sessions per day. You may provide gentle assistance with your other hand but do not force the movement.       AROM: Forearm Pronation / Supination   With _left___ arm in handshake position, slowly rotate palm down until stretch is felt. Relax. Then rotate palm up until stretch is felt. Repeat _15___ times per set. Do _4-6___ sessions per day. You may assist gently with your other hand.  Copyright  VHI. All rights reserved.     MP Flexion (Active Isolated)   Bend _each____ finger at large knuckle, keeping other fingers straight. Do not bend tips. Repeat _10-15___ times. Do __4-6__ sessions per day.      AROM: Finger Flexion / Extension   Actively bend fingers of  hand. Start with knuckles furthest from palm, and slowly make a fist. Hold __5__ seconds. Relax. Then straighten fingers as far as possible. Repeat _10-15___ times per set.  Do _4-6___ sessions per day.  Copyright  VHI. All rights reserved.   Opposition (Active)   Touch tip of thumb to nail tip of each finger in turn, making an "O" shape. Repeat __10__ times. Do _4-6___ sessions per day.   MP Flexion (Active)   Bend thumb to touch base of little finger, keeping tip joint straight. Repeat __10-15__ times. Do _4-6___ sessions per day.

## 2017-02-26 ENCOUNTER — Ambulatory Visit: Payer: 59 | Admitting: Occupational Therapy

## 2017-02-26 DIAGNOSIS — M25532 Pain in left wrist: Secondary | ICD-10-CM

## 2017-02-26 DIAGNOSIS — M25632 Stiffness of left wrist, not elsewhere classified: Secondary | ICD-10-CM | POA: Diagnosis not present

## 2017-02-26 DIAGNOSIS — R278 Other lack of coordination: Secondary | ICD-10-CM

## 2017-02-26 NOTE — Patient Instructions (Signed)
For these exercises let pain be your guide, do not force range, stop if increased pain  PROM: Wrist Flexion / Extension   Grasp  hand and slowly bend wrist until stretch is felt. Relax. Then stretch as far as possible in opposite direction. Be sure to keep elbow bent.  Hold __10__ sec. each way Repeat _10__ times per set.    Do _2-3___ sessions per day.  Pronation (Passive)   Keep elbow bent at right angle and held firmly to side. Use other hand to turn forearm until palm faces downward. Hold _10___ seconds. Repeat __10__ times. Do _2-3___ sessions per day.  Supination (Passive)   Keep elbow bent at right angle and held firmly at side. Use other hand to turn forearm until palm faces upward. Hold __10__ seconds. Repeat __10__ times. Do _2-3___ sessions per day.  Copyright  VHI. All rights reserved.

## 2017-02-26 NOTE — Therapy (Signed)
[facility] [address] [place], [place], [address] Phone: [phone]   Fax:  [phone]  Occupational Therapy Treatment  Patient Details  Name: Frederick Bauer MRN: [id number] Date of Birth: Oct 11, 1996 Referring Provider: Dr Mack Hook  Encounter Date: 02/26/2017      OT End of Session - 02/26/17 0902    Visit Number 3   Number of Visits 12   Date for OT Re-Evaluation 03/27/17   Authorization Type [employer] 0 visit limit, 0 auth required. $40 copay   OT Start Time 0848   OT Stop Time 0930   OT Time Calculation (min) 42 min      Past Medical History:  Diagnosis Date  . Distal radius fracture, left 01/22/2017    Past Surgical History:  Procedure Laterality Date  . NO PAST SURGERIES    . OPEN REDUCTION INTERNAL FIXATION (ORIF) DISTAL RADIAL FRACTURE Left 01/28/2017   Procedure: OPEN TREATMENT OF LEFT DISTAL RADIUS FRACTURE;  Surgeon: Mack Hook, MD;  Location: [facility];  Service: Orthopedics;  Laterality: Left;  GENERAL ANESTHESIA WITH PRE-OP BLOCK    There were no vitals filed for this visit.      Subjective Assessment - 02/26/17 0901    Subjective  Pain with exercise   Limitations A/ROM initiated as per previous discussion with Dr. Janee Morn for his distal radius fx pt's to begin ROM at time of splint fabrication   Currently in Pain? Yes   Pain Score 3    Pain Location Wrist   Pain Orientation Right   Pain Descriptors / Indicators Aching   Pain Type Acute pain   Pain Onset More than a month ago   Pain Frequency Intermittent   Aggravating Factors  exercise   Pain Relieving Factors rest   Multiple Pain Sites No        Paraffin x 10 mins  To left wrist and hand for stiffness/ pain, no adverse reactions A/ROM wrist flexion/ extension,ulnar / radial deviation and supination pronation followed gentle self P/ROM wrist flexion/ extension and supination(as per Dr Carollee Massed protocol)  . A/ROM finger flexion/ extension x10 reps, tendon gliding Forearm gym x 5 reps                      OT Education - 02/27/17 1637    Education provided Yes   Education Details instructed in self ROM, see pt instructions   Person(s) Educated Patient   Methods Explanation;Demonstration;Verbal cues;Handout   Comprehension Verbalized understanding;Returned demonstration;Verbal cues required          OT Short Term Goals - 02/13/17 1234      OT SHORT TERM GOAL #1   Title Pt wil lbe Mod I splinting use, care and precautions L wrist   Time 3   Period Weeks   Status New     OT SHORT TERM GOAL #2   Title Pt will be Mod I edema control techniques left hand/wrist   Time 3   Period Weeks   Status New     OT SHORT TERM GOAL #3   Title Pt will report Mod I light ADL's related to grooming   Baseline 3   Period Weeks   Status New           OT Long Term Goals - 02/13/17 1235      OT LONG TERM GOAL #1   Title Pt will be Mod I updated HEP for L wrist/hand   Time 6  Period Weeks   Status New     OT LONG TERM GOAL #2   Title Pt will demonstrate AROM L wrist WFL's for performance of ADL's and school related tasks as seen by goniometer measurement   Time 6   Period Weeks   Status New     OT LONG TERM GOAL #3   Title Pt will demonstrate functional grip left hand via JAMAR assessment as compared to right dominant hand.   Time 6   Period Weeks   Status New     OT LONG TERM GOAL #4   Title Pt will be Independent ADL's and school related activities using left hand as non-dominant hand.   Time 6   Period Weeks   Status New               Plan - 02/27/17 1635    Clinical Impression Statement Pt is progressing towards goals. He demonstrates improved A/ROM and AA/ROM. Therapist instructed pt in gentle self P/ROM as per Dr. Carollee Massedhompson's protocol. Pt requests to cx next week and return the following week.   Rehab Potential Good   OT Frequency --  1-2x week    OT Duration 6 weeks   OT Treatment/Interventions Self-care/ADL training;Therapeutic exercise;Moist Heat;Parrafin;Splinting;Fluidtherapy;Scar mobilization;Therapeutic exercises;Patient/family education;Ultrasound;Cryotherapy;DME and/or AE instruction;Manual Therapy;Passive range of motion;Therapeutic activities   Plan continue A/ROM, AA/ROM and pt self P/ROM, send note with pt to MD next visit to clarify when he can strengthen   Consulted and Agree with Plan of Care Patient      Patient will benefit from skilled therapeutic intervention in order to improve the following deficits and impairments:  Increased edema, Impaired flexibility, Pain, Decreased coordination, Decreased mobility, Decreased scar mobility, Decreased activity tolerance, Decreased range of motion, Decreased strength, Decreased knowledge of precautions, Impaired UE functional use  Visit Diagnosis: Other lack of coordination  Pain in left wrist  Wrist stiffness, left    Problem List There are no active problems to display for this patient.   Khalia Gong 02/27/2017, 4:39 PM Keene BreathKathryn Lennyn Gange, OTR/L [phone] Phone: [phone] 4:39 PM 02/27/17 [facility] [address] [place], [place], [address] Phone: [phone]   Fax:  [phone]  Name: [name] MRN: [id number] Date of Birth: 05/13/1996

## 2017-03-05 ENCOUNTER — Encounter: Payer: 59 | Admitting: Occupational Therapy

## 2017-03-12 ENCOUNTER — Encounter: Payer: Self-pay | Admitting: Occupational Therapy

## 2017-03-12 ENCOUNTER — Ambulatory Visit: Payer: 59 | Attending: Orthopedic Surgery | Admitting: Occupational Therapy

## 2017-03-12 DIAGNOSIS — R278 Other lack of coordination: Secondary | ICD-10-CM | POA: Insufficient documentation

## 2017-03-12 DIAGNOSIS — R601 Generalized edema: Secondary | ICD-10-CM | POA: Diagnosis present

## 2017-03-12 DIAGNOSIS — M25532 Pain in left wrist: Secondary | ICD-10-CM | POA: Insufficient documentation

## 2017-03-12 DIAGNOSIS — M25632 Stiffness of left wrist, not elsewhere classified: Secondary | ICD-10-CM | POA: Diagnosis present

## 2017-03-12 NOTE — Therapy (Signed)
[facility] [address] [place], [place], [address] Phone: [phone]   Fax:  [phone]  Occupational Therapy Treatment  Patient Details  Name: Frederick Bauer MRN: [id number] Date of Birth: 1996/02/17 Referring Provider: Dr Mack Hook  Encounter Date: 03/12/2017      OT End of Session - 03/12/17 1043    Visit Number 4   Number of Visits 12   Date for OT Re-Evaluation 03/27/17   Authorization Type [employer] 0 visit limit, 0 auth required. $40 copay   OT Start Time 0931   OT Stop Time 1013   OT Time Calculation (min) 42 min   Activity Tolerance Patient tolerated treatment well      Past Medical History:  Diagnosis Date  . Distal radius fracture, left 01/22/2017    Past Surgical History:  Procedure Laterality Date  . NO PAST SURGERIES    . OPEN REDUCTION INTERNAL FIXATION (ORIF) DISTAL RADIAL FRACTURE Left 01/28/2017   Procedure: OPEN TREATMENT OF LEFT DISTAL RADIUS FRACTURE;  Surgeon: Mack Hook, MD;  Location: [facility];  Service: Orthopedics;  Laterality: Left;  GENERAL ANESTHESIA WITH PRE-OP BLOCK    There were no vitals filed for this visit.      Subjective Assessment - 03/12/17 0938    Subjective  It only hurts when I do my exercise - I see Dr. Janee Morn tomorrow.   Limitations A/ROM initiated as per previous discussion with Dr. Janee Morn for his distal radius fx pt's to begin ROM at time of splint fabrication   Currently in Pain? Yes   Pain Score 3   when I do my exercises   Pain Location Wrist   Pain Orientation Left   Pain Descriptors / Indicators Sore   Pain Type Acute pain   Pain Onset More than a month ago   Pain Frequency Intermittent   Aggravating Factors  exercise   Pain Relieving Factors rest, feels fine in the splint                      OT Treatments/Exercises (OP) - 03/12/17 0001      Exercises   Exercises Wrist     Wrist Exercises   Other  wrist exercises Wrist flexion/extension AROM followed by gentle self PROM 10 reps x2, ulnar/radial deviation AROM 10 reps x2, supination AROM/ gentle self PROM 10 reps x2 - all tolerated well. Pt reported pain level of only 1/10 with wrist extension only.  Pt has gained in ROM and has clearly been compliant with HEP.  Pt to see Dr. Janee Morn tomorrow for follow up - note sent with pt requesting time frame when pt can start to do strengthening as well as therapist completing AAROM for wrist and hand. Pt to obtain information and return with note next visit.    Other wrist exercises AROM finger flexion/extension 10 reps x2 tendon gliding.  Pt reports no restrictions or pain when completing.      Modalities   Modalities Paraffin  to L wrist/hand to decreased tightness/pain before exercise     Manual Therapy   Manual Therapy Other (comment)   Manual therapy comments scar massage along incision line on volar surface of scar.  Pt reports he is doing this inconsistently at home. Discussed rational for scar massage and pt verbalized understanding.                   OT Short Term Goals - 03/12/17 1037  OT SHORT TERM GOAL #1   Title Pt wil lbe Mod I splinting use, care and precautions L wrist- 03/20/2017 - date adjusted as pt was on hold last week)   Time 3   Period Weeks   Status Achieved     OT SHORT TERM GOAL #2   Title Pt will be Mod I edema control techniques left hand/wrist   Time 3   Period Weeks   Status Deferred  pt reports no swelling at any time     OT SHORT TERM GOAL #3   Title Pt will report Mod I light ADL's related to grooming   Baseline 3   Period Weeks   Status New           OT Long Term Goals - 03/12/17 1037      OT LONG TERM GOAL #1   Title Pt will be Mod I updated HEP for L wrist/hand - 04/17/2017 (date adjusted as pt was on hold last week)   Time 6   Period Weeks   Status On-going     OT LONG TERM GOAL #2   Title Pt will demonstrate AROM L wrist  WFL's for performance of ADL's and school related tasks as seen by goniometer measurement   Time 6   Period Weeks   Status On-going     OT LONG TERM GOAL #3   Title Pt will demonstrate functional grip left hand via JAMAR assessment as compared to right dominant hand.   Time 6   Period Weeks   Status On-going     OT LONG TERM GOAL #4   Title Pt will be Independent ADL's and school related activities using left hand as non-dominant hand.   Time 6   Period Weeks   Status On-going               Plan - 03/12/17 1040    Clinical Impression Statement Pt progressing toward goals. Pt is making excellent progress toward improving AROM and is clearly compliant with HEP   Rehab Potential Good   OT Frequency --  1-2x/wk   OT Duration 6 weeks   OT Treatment/Interventions Self-care/ADL training;Therapeutic exercise;Moist Heat;Parrafin;Splinting;Fluidtherapy;Scar mobilization;Therapeutic exercises;Patient/family education;Ultrasound;Cryotherapy;DME and/or AE instruction;Manual Therapy;Passive range of motion;Therapeutic activities   Plan seek note from MD from pt, continue A/PROM, AA/ROM and pt self P/ROM, start strengthening if pt is medically cleared   Consulted and Agree with Plan of Care Patient      Patient will benefit from skilled therapeutic intervention in order to improve the following deficits and impairments:  Increased edema, Impaired flexibility, Pain, Decreased coordination, Decreased mobility, Decreased scar mobility, Decreased activity tolerance, Decreased range of motion, Decreased strength, Decreased knowledge of precautions, Impaired UE functional use  Visit Diagnosis: Other lack of coordination  Pain in left wrist  Wrist stiffness, left  Generalized edema    Problem List There are no active problems to display for this patient.   Norton Pastel, OTR/L 03/12/2017, 10:45 AM  [facility] [address] [place], [place], [address] Phone: [phone]   Fax:  [phone]  Name: Frederick Bauer MRN: [id number] Date of Birth: 06/24/1996

## 2017-03-19 ENCOUNTER — Encounter: Payer: 59 | Admitting: Occupational Therapy

## 2017-03-26 ENCOUNTER — Ambulatory Visit: Payer: 59 | Admitting: Occupational Therapy

## 2017-04-02 ENCOUNTER — Ambulatory Visit: Payer: 59 | Admitting: Occupational Therapy

## 2017-04-03 ENCOUNTER — Ambulatory Visit: Payer: 59 | Attending: Orthopedic Surgery | Admitting: Occupational Therapy

## 2017-04-03 DIAGNOSIS — M25632 Stiffness of left wrist, not elsewhere classified: Secondary | ICD-10-CM | POA: Diagnosis present

## 2017-04-03 DIAGNOSIS — R278 Other lack of coordination: Secondary | ICD-10-CM | POA: Insufficient documentation

## 2017-04-03 NOTE — Patient Instructions (Signed)
  Extension (Resistive)    With wrist over edge of table, lift __2 lbs, keeping arm on table surface. Hold __3__ seconds. Lower slowly. Repeat _10-15___ times. Do _2___ sessions per day.   Flexion (Resistive)    With hand palm-up and holding __2 lbs, bend hand toward you at wrist. Hold _3___ seconds. Relax slowly. Repeat __10-15__ times. Do __2__ sessions per day.  Wrist Radial Deviation: Resisted    With right thumb up, __2__ pound weight in hand, bend wrist up. Return slowly. Repeat __10-15__ times per set.  Do __2__ sessions per day.  Pronation / Supination (Resistive)    Hold light hammer, rotate palm up and down. Keep elbow flexed at side and wrist straight. Repeat _10-15___ times. Do __2__ sessions per day.  1. Grip Strengthening (Resistive Putty)   Squeeze putty using thumb and all fingers. Repeat _20___ times. Do __2__ sessions per day.   2. Roll putty into tube on table and pinch between each finger and thumb x 10 reps each. (Do ring and small finger together)

## 2017-04-03 NOTE — Therapy (Signed)
[facility] [address] [place], [place], [address] Phone: [phone]   Fax:  [phone]  Occupational Therapy Treatment  Patient Details  Name: Frederick Bauer MRN: [id number] Date of Birth: Oct 25, 1996 Referring Provider: Dr Milly Jakob  Encounter Date: 04/03/2017      OT End of Session - 04/03/17 1230    Visit Number 5   Number of Visits 12   Date for OT Re-Evaluation 04/17/17   Authorization Type [employer] 0 visit limit, 0 auth required. $40 copay   OT Start Time 1148   OT Stop Time 1226   OT Time Calculation (min) 38 min   Activity Tolerance Patient tolerated treatment well      Past Medical History:  Diagnosis Date  . Distal radius fracture, left 01/22/2017    Past Surgical History:  Procedure Laterality Date  . NO PAST SURGERIES    . OPEN REDUCTION INTERNAL FIXATION (ORIF) DISTAL RADIAL FRACTURE Left 01/28/2017   Procedure: OPEN TREATMENT OF LEFT DISTAL RADIUS FRACTURE;  Surgeon: Milly Jakob, MD;  Location: [facility];  Service: Orthopedics;  Laterality: Left;  GENERAL ANESTHESIA WITH PRE-OP BLOCK    There were no vitals filed for this visit.      Subjective Assessment - 04/03/17 1152    Subjective  I don't have any pain   Limitations Pt now 9 weeks post-op and initiated strengthening per protocol   Currently in Pain? No/denies            [facility] OT Assessment - 04/03/17 0001      AROM   Overall AROM Comments wrist flex = 65*, ext = 62*, sup = 90*, pron = 80*     Hand Function   Right Hand Grip (lbs) 88 lbs   Left Hand Grip (lbs) 55 lbs                  OT Treatments/Exercises (OP) - 04/03/17 0001      ADLs   ADL Comments Reviewed scar massage and instructed to perform for another month. Pt also issued gel padding to apply to scar at night to help with scarring.      Exercises   Exercises Hand     Wrist Exercises   Other wrist exercises Pt issued  strengthening HEP for wrist/forearm - see pt instructions for details   Other wrist exercises wrist winder x 4 full revolutions with 2 lb. weight     Hand Exercises   Other Hand Exercises Pt issued putty HEP for grip and pinch strengthening. Pt issued green putty. See pt instructions for details                OT Education - 04/03/17 1205    Education provided Yes   Education Details Strengthening HEP, scar management    Person(s) Educated Patient   Methods Explanation;Demonstration;Handout   Comprehension Verbalized understanding;Returned demonstration          OT Short Term Goals - 04/03/17 1232      OT SHORT TERM GOAL #1   Title Pt wil lbe Mod I splinting use, care and precautions L wrist- 03/20/2017 - date adjusted as pt was on hold last week)   Time 3   Period Weeks   Status Achieved     OT SHORT TERM GOAL #2   Title Pt will be Mod I edema control techniques left hand/wrist   Time 3   Period Weeks   Status Deferred  pt reports no  swelling at any time     OT SHORT TERM GOAL #3   Title Pt will report Mod I light ADL's related to grooming   Baseline 3   Period Weeks   Status Achieved           OT Long Term Goals - 04/03/17 1232      OT LONG TERM GOAL #1   Title Pt will be Mod I updated HEP for L wrist/hand - 04/17/2017 (date adjusted as pt was on hold last week)   Time 6   Period Weeks   Status Achieved     OT LONG TERM GOAL #2   Title Pt will demonstrate AROM L wrist WFL's for performance of ADL's and school related tasks as seen by goniometer measurement   Time 6   Period Weeks   Status Achieved  see assessment for 04/03/17     OT LONG TERM GOAL #3   Title Pt will demo 65 lbs or greater grip strength Lt hand    Baseline 04/03/17: 55 lbs (Rt = 88 lbs)    Time 6   Period Weeks   Status Revised     OT LONG TERM GOAL #4   Title Pt will be Independent ADL's and school related activities using left hand as non-dominant hand.   Time 6   Period  Weeks   Status On-going               Plan - 04/03/17 1334    Clinical Impression Statement Pt has met STG's and some LTG's; progressing towards remaining LTG's. Pt began strengthening today as pt is 9 weeks post-op.    Rehab Potential Good   OT Frequency 1x / week   OT Duration 6 weeks   OT Treatment/Interventions Self-care/ADL training;Therapeutic exercise;Moist Heat;Parrafin;Splinting;Fluidtherapy;Scar mobilization;Therapeutic exercises;Patient/family education;Ultrasound;Cryotherapy;DME and/or AE instruction;Manual Therapy;Passive range of motion;Therapeutic activities   Plan re-assess grip strength and remaining LTG's, assess scar, d/c next session   Consulted and Agree with Plan of Care Patient      Patient will benefit from skilled therapeutic intervention in order to improve the following deficits and impairments:  Increased edema, Impaired flexibility, Pain, Decreased coordination, Decreased mobility, Decreased scar mobility, Decreased activity tolerance, Decreased range of motion, Decreased strength, Decreased knowledge of precautions, Impaired UE functional use  Visit Diagnosis: Wrist stiffness, left  Other lack of coordination    Problem List There are no active problems to display for this patient.   Carey Bullocks, OTR/L 04/03/2017, 1:36 PM  [facility] [address] [place], [place], [address] Phone: [phone]   Fax:  [phone]  Name: Jordie Schreur MRN: [id number] Date of Birth: 1996-10-28

## 2017-04-10 ENCOUNTER — Ambulatory Visit: Payer: 59 | Admitting: Occupational Therapy

## 2017-05-30 ENCOUNTER — Encounter: Payer: Self-pay | Admitting: Occupational Therapy

## 2017-05-30 NOTE — Therapy (Signed)
[facility] [address] [place], [place], [address] Phone: [phone]   Fax:  [phone]  Patient Details  Name: Frederick Bauer MRN: [id number] Date of Birth: [birth year] Referring Provider:  Dr. Milly Jakob Encounter Date: 05/30/2017  OCCUPATIONAL THERAPY DISCHARGE SUMMARY  Visits from Start of Care: 5  Current functional level related to goals / functional outcomes: See last treatment note on 04/03/17 for update towards goals   Remaining deficits: strength   Education / Equipment: HEP, splint wear and care, scar massage  Plan: Patient agrees to discharge.  Patient goals were met. Patient is being discharged due to not returning since the last visit.  Pt was supposed to come 1 last appt to check updated goals and re-assess grip strength for d/c but did not come?????         Carey Bullocks, OTR/L 05/30/2017, 3:27 PM  [facility] [address] [place], [place], [address] Phone: [phone]   Fax:  [phone]

## 2017-09-05 IMAGING — CR DG WRIST COMPLETE 3+V*L*
4 series · 4 of 4 positions shown · non-contrast
Comparison: None.

CLINICAL DATA: Status post fall while dunking basketball, with
generalized left wrist pain and deformity. Initial encounter.

EXAM:
LEFT WRIST - COMPLETE 3+ VIEW

[wrist pa]
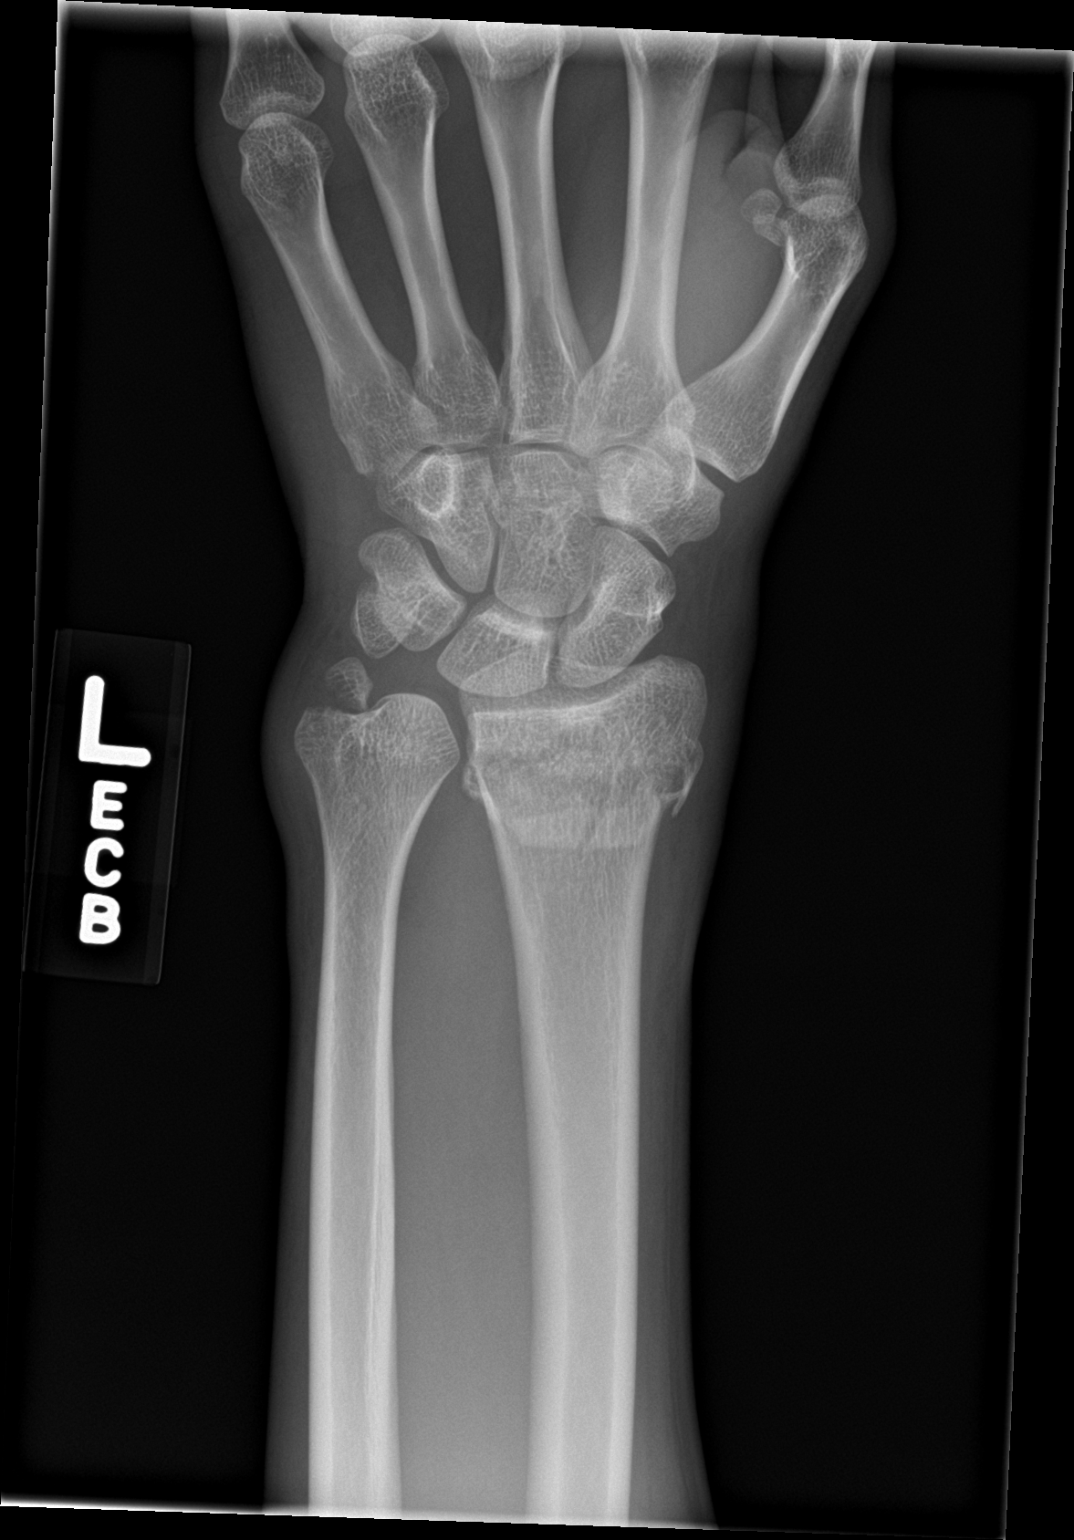

[wrist obl]
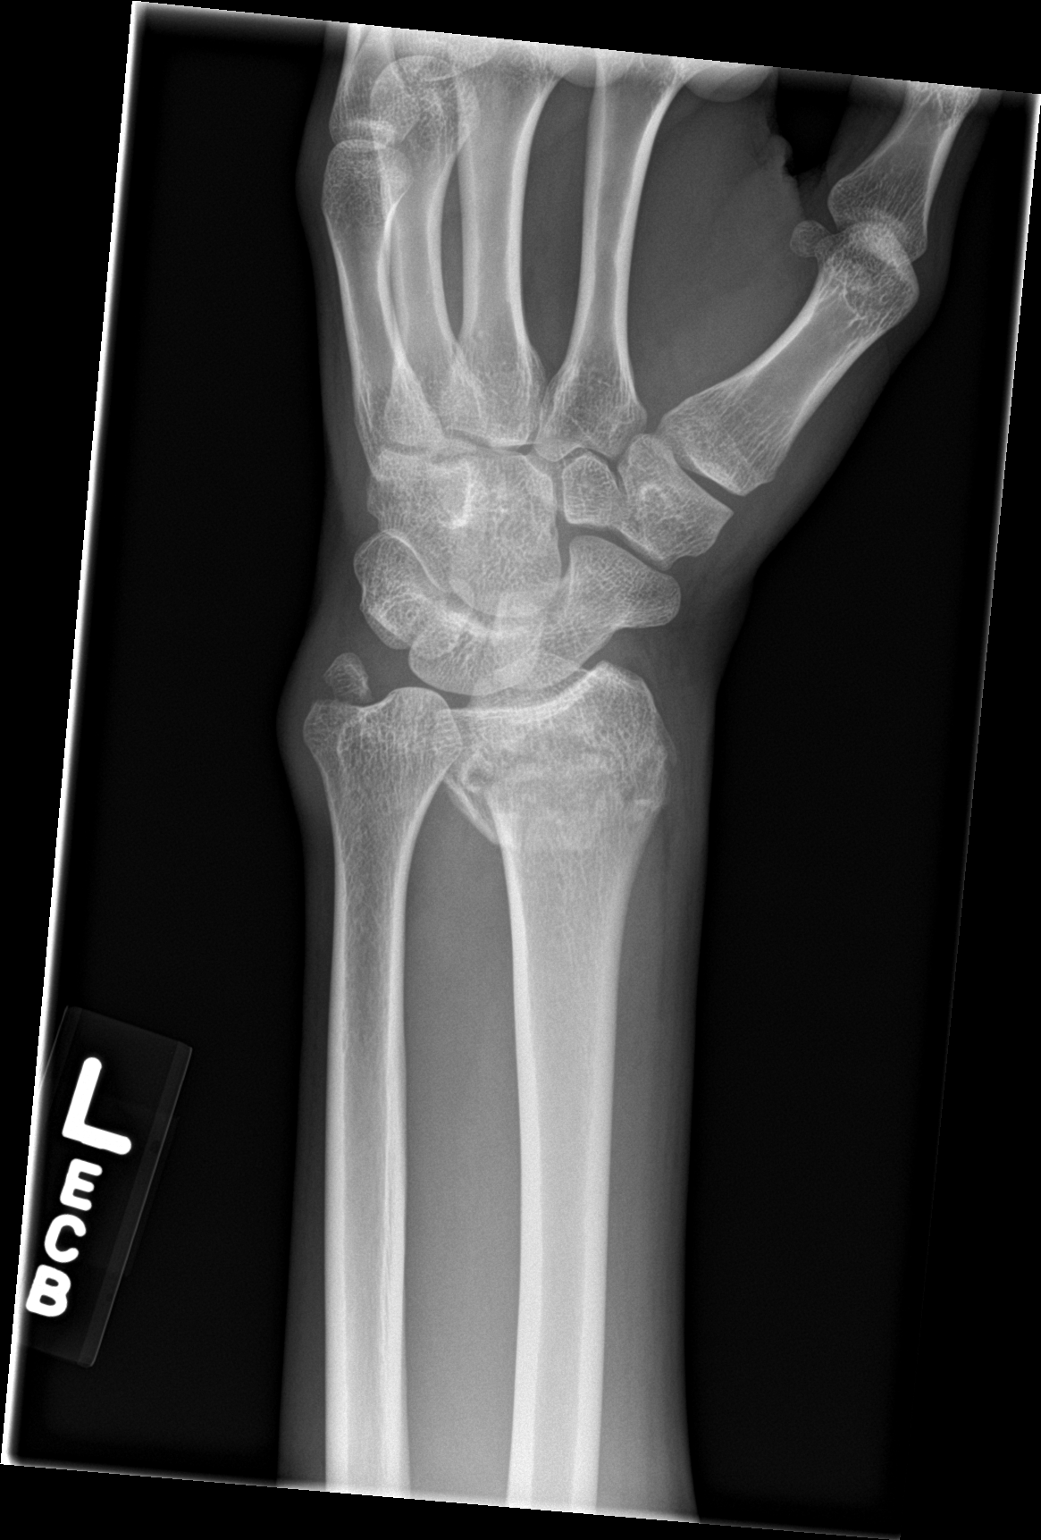

[wrist lat]
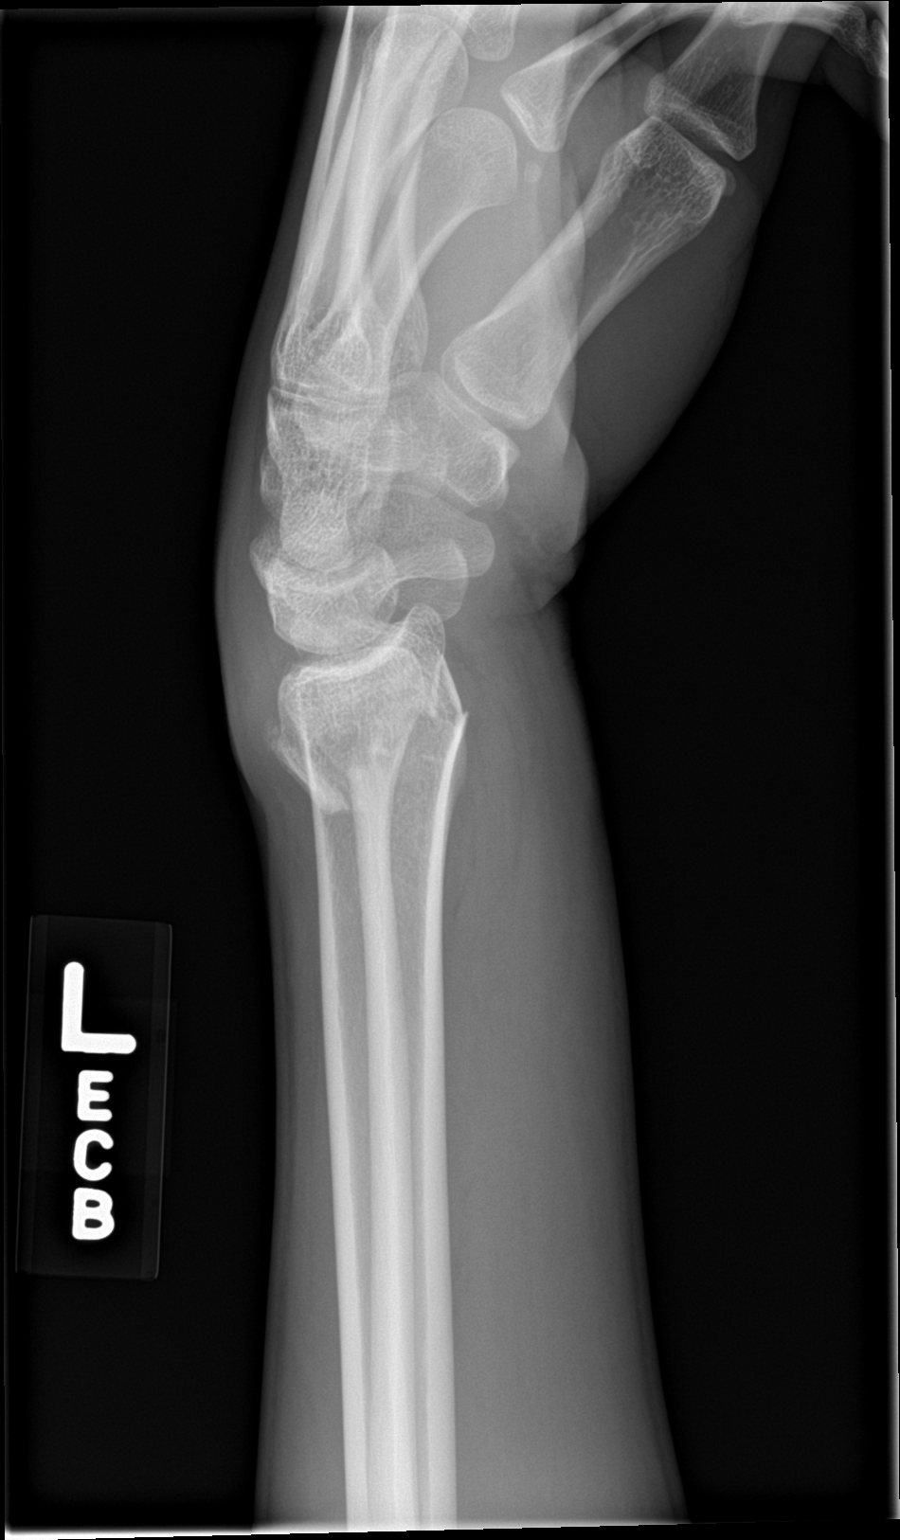

[wrist navicular]
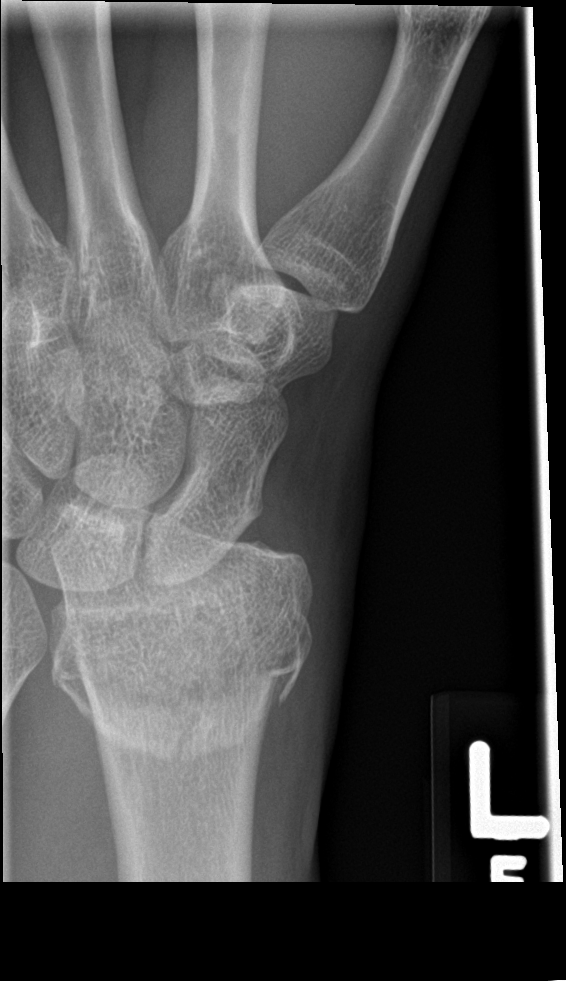

[4 of 4 positions shown; findings below may reference images not displayed]

FINDINGS: There is a comminuted and impacted fracture of the distal radius,
with mild dorsal angulation. A displaced ulnar styloid fracture is
also seen.

The carpal rows articulate normally with the distal radial
fragments. Surrounding soft swelling is noted.
IMPRESSION: Comminuted and impacted fracture of the distal radius, with mild
dorsal angulation. Displaced ulnar styloid fracture noted.

## 2019-09-01 ENCOUNTER — Encounter: Payer: Self-pay | Admitting: Emergency Medicine

## 2019-09-01 ENCOUNTER — Ambulatory Visit
Admission: EM | Admit: 2019-09-01 | Discharge: 2019-09-01 | Disposition: A | Discharge status: Home / Self Care | Payer: 59 | Attending: Physician Assistant | Admitting: Physician Assistant

## 2019-09-01 ENCOUNTER — Other Ambulatory Visit: Payer: Self-pay

## 2019-09-01 DIAGNOSIS — M25471 Effusion, right ankle: Secondary | ICD-10-CM | POA: Diagnosis not present

## 2019-09-01 DIAGNOSIS — M25571 Pain in right ankle and joints of right foot: Secondary | ICD-10-CM

## 2019-09-01 MED ORDER — MELOXICAM 7.5 MG PO TABS: 7.5000 mg | ORAL_TABLET | Freq: Every day | ORAL | 0 refills | Status: AC

## 2019-09-01 NOTE — Discharge Instructions (Signed)
Start Mobic. Do not take ibuprofen (motrin/advil)/ naproxen (aleve) while on mobic. Ice compress, elevation, ace wrap during activity. If swelling does not improve in 1 week, follow up with sports medicine for further evaluation needed.

## 2019-09-01 NOTE — ED Triage Notes (Signed)
Patient presents to [facility] for assessment of right ankle pain after "rolling it" playing basketball last Wednesday.  States swelling has not decreased.

## 2019-09-01 NOTE — ED Notes (Signed)
Patient able to ambulate independently  

## 2019-09-01 NOTE — ED Provider Notes (Signed)
[name]    CSN: [id number] Arrival date & time: 09/01/19  1239      History   Chief Complaint Chief Complaint  Patient presents with  . Ankle Pain    HPI Frederick Bauer is a 23 y.o. male.   23 year old male comes in for evaluation after injury of right ankle 5 days ago.  States inverted ankle while playing basketball with pain and swelling.  Patient states was able to bear weight immediately after incident, but was limping for about 3 days.  States pain has resolved at this time, but continues to have swelling to the ankle.  No obvious decrease in swelling overnight.  No swelling to the calf.  Has been doing ice compress, Ace wrap without improvement.     Past Medical History:  Diagnosis Date  . Distal radius fracture, left 01/22/2017    There are no active problems to display for this patient.   Past Surgical History:  Procedure Laterality Date  . NO PAST SURGERIES    . OPEN REDUCTION INTERNAL FIXATION (ORIF) DISTAL RADIAL FRACTURE Left 01/28/2017   Procedure: OPEN TREATMENT OF LEFT DISTAL RADIUS FRACTURE;  Surgeon: Mack Hook, MD;  Location:  [facility];  Service: Orthopedics;  Laterality: Left;  GENERAL ANESTHESIA WITH PRE-OP BLOCK       Home Medications    Prior to Admission medications   Medication Sig Start Date End Date Taking? Authorizing Provider  meloxicam (MOBIC) 7.5 MG tablet Take 1 tablet (7.5 mg total) by mouth daily. 09/01/19   Belinda Fisher, PA-C    Family History History reviewed. No pertinent family history.  Social History Social History   Tobacco Use  . Smoking status: Never Smoker  . Smokeless tobacco: Never Used  Substance Use Topics  . Alcohol use: No  . Drug use: No     Allergies   Patient has no known allergies.   Review of Systems Review of Systems  Reason unable to perform ROS: See HPI as above.     Physical Exam Triage Vital Signs ED Triage Vitals [09/01/19 1248]  Enc Vitals Group   BP 132/80     Pulse Rate (!) 55     Resp 16     Temp 98.4 F (36.9 C)     Temp Source Oral     SpO2 98 %     Weight      Height      Head Circumference      Peak Flow      Pain Score 3     Pain Loc      Pain Edu?      Excl. in GC?    No data found.  Updated Vital Signs BP 132/80 (BP Location: Left Arm)   Pulse (!) 55   Temp 98.4 F (36.9 C) (Oral)   Resp 16   SpO2 98%   Physical Exam Constitutional:      General: He is not in acute distress.    Appearance: He is well-developed. He is not diaphoretic.  HENT:     Head: Normocephalic and atraumatic.  Eyes:     Conjunctiva/sclera: Conjunctivae normal.     Pupils: Pupils are equal, round, and reactive to light.  Pulmonary:     Effort: Pulmonary effort is normal. No respiratory distress.  Musculoskeletal:     Comments: Swelling to the right ankle, lateral greater than medial.  No erythema, warmth, contusion.  No tenderness to palpation of ankle, foot.  Full  range of motion of ankle and foot.  Strength normal equal bilaterally.  Sensation intact ankle bilaterally.  Pedal pulse 2+.  No swelling to right calf, no tenderness to palpation.  Negative Homans.  Skin:    General: Skin is warm and dry.  Neurological:     Mental Status: He is alert and oriented to person, place, and time.      UC Treatments / Results  Labs (all labs ordered are listed, but only abnormal results are displayed) Labs Reviewed - No data to display  EKG   Radiology No results found.  Procedures Procedures (including critical care time)  Medications Ordered in UC Medications - No data to display  Initial Impression / Assessment and Plan / UC Course  I have reviewed the triage vital signs and the nursing notes.  Pertinent labs & imaging results that were available during my care of the patient were reviewed by me and considered in my medical decision making (see chart for details).    Discuss swelling most likely due to injury.  Given  resolution of pain, ?  Sprain causing symptoms.  Offered x-ray, for which patient declined at this time.  Will provide symptomatic treatment with NSAIDs, ice compress.  Discussed correct way of Ace wrap.  Patient to follow-up with sports medicine if symptoms do not resolve.  Return precautions given.  Patient expresses understanding and agrees to plan.  Final Clinical Impressions(s) / UC Diagnoses   Final diagnoses:  Pain and swelling of right ankle    ED Prescriptions    Medication Sig Dispense Auth. Provider   meloxicam (MOBIC) 7.5 MG tablet Take 1 tablet (7.5 mg total) by mouth daily. 15 tablet Ok Edwards, PA-C     PDMP not reviewed this encounter.   Ok Edwards, PA-C 09/01/19 1305
